# Patient Record
Sex: Female | Born: 1990 | Race: White | Hispanic: No | Marital: Married | Smoking: Never smoker
Health system: Southern US, Community
[De-identification: ages and names within clinical notes are randomized; demographics above are authoritative.]

## PROBLEM LIST (undated history)

## (undated) DIAGNOSIS — Z789 Other specified health status: Secondary | ICD-10-CM

## (undated) HISTORY — PX: NO PAST SURGERIES: SHX2092

## (undated) HISTORY — DX: Other specified health status: Z78.9

---
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
## 2010-05-16 ENCOUNTER — Inpatient Hospital Stay (INDEPENDENT_AMBULATORY_CARE_PROVIDER_SITE_OTHER)
Admission: RE | Admit: 2010-05-16 | Discharge: 2010-05-16 | Disposition: A | Discharge status: Home / Self Care | Payer: Self-pay | Source: Ambulatory Visit | Attending: Emergency Medicine | Admitting: Emergency Medicine

## 2010-05-16 DIAGNOSIS — H6120 Impacted cerumen, unspecified ear: Secondary | ICD-10-CM

## 2015-11-06 LAB — AMB REFERRAL TO OB-GYN: PAP SMEAR: NEGATIVE

## 2016-11-17 DIAGNOSIS — J302 Other seasonal allergic rhinitis: Secondary | ICD-10-CM | POA: Insufficient documentation

## 2017-10-15 LAB — OB RESULTS CONSOLE HGB/HCT, BLOOD
HEMATOCRIT: 39 (ref 29–41)
Hemoglobin: 13

## 2017-10-15 LAB — OB RESULTS CONSOLE RUBELLA ANTIBODY, IGM: Rubella: IMMUNE

## 2017-10-15 LAB — OB RESULTS CONSOLE GC/CHLAMYDIA
Chlamydia: NEGATIVE
Gonorrhea: NEGATIVE

## 2017-10-15 LAB — OB RESULTS CONSOLE HIV ANTIBODY (ROUTINE TESTING): HIV: NONREACTIVE

## 2017-10-15 LAB — OB RESULTS CONSOLE PLATELET COUNT: PLATELETS: 347

## 2017-10-15 LAB — OB RESULTS CONSOLE ABO/RH: RH Type: POSITIVE

## 2017-10-15 LAB — OB RESULTS CONSOLE HEPATITIS B SURFACE ANTIGEN: Hepatitis B Surface Ag: NEGATIVE

## 2017-10-15 LAB — AMB REFERRAL TO OB-GYN: Urine Culture, OB: NEGATIVE

## 2017-12-03 ENCOUNTER — Encounter: Payer: Self-pay | Admitting: *Deleted

## 2017-12-04 ENCOUNTER — Telehealth: Payer: Self-pay

## 2017-12-04 ENCOUNTER — Other Ambulatory Visit: Payer: Self-pay

## 2017-12-04 DIAGNOSIS — Z3480 Encounter for supervision of other normal pregnancy, unspecified trimester: Secondary | ICD-10-CM

## 2017-12-04 NOTE — Telephone Encounter (Signed)
Opened in error

## 2017-12-14 ENCOUNTER — Encounter (HOSPITAL_COMMUNITY): Payer: Self-pay

## 2017-12-17 ENCOUNTER — Ambulatory Visit (INDEPENDENT_AMBULATORY_CARE_PROVIDER_SITE_OTHER): Payer: BLUE CROSS/BLUE SHIELD | Admitting: Internal Medicine

## 2017-12-17 ENCOUNTER — Encounter: Payer: Self-pay | Admitting: Internal Medicine

## 2017-12-17 VITALS — BP 114/73 | HR 113 | Ht 65.0 in | Wt 144.0 lb

## 2017-12-17 DIAGNOSIS — Z3482 Encounter for supervision of other normal pregnancy, second trimester: Secondary | ICD-10-CM

## 2017-12-17 DIAGNOSIS — Z23 Encounter for immunization: Secondary | ICD-10-CM

## 2017-12-17 DIAGNOSIS — Z3480 Encounter for supervision of other normal pregnancy, unspecified trimester: Secondary | ICD-10-CM

## 2017-12-17 NOTE — Progress Notes (Signed)
Subjective:   Amanda Caldwell is a 27 y.o. G1P0 at [redacted]w[redacted]d by LMP, early ultrasound at 8 weeks being seen today for her first obstetrical visit.  Her obstetrical history is significant for none. . Patient does intend to breast feed. Pregnancy history fully reviewed.  Patient reports no complaints.  HISTORY: OB History  Gravida Para Term Preterm AB Living  1 0 0 0 0 0  SAB TAB Ectopic Multiple Live Births  0 0 0 0 0    # Outcome Date GA Lbr Len/2nd Weight Sex Delivery Anes PTL Lv  1 Current            History reviewed. No pertinent past medical history. Reports childhood history of asthma (has not had to use an inhaler since age of 50-79 years old) and childhood history of migraines.  History reviewed. No pertinent surgical history. Elbow surgery for fractured olecranon.  History reviewed. No pertinent family history. Social History   Tobacco Use  . Smoking status: Not on file  Substance Use Topics  . Alcohol use: Not on file  . Drug use: Not on file   Allergies  Allergen Reactions  . Amoxicillin Swelling   Current Outpatient Medications on File Prior to Visit  Medication Sig Dispense Refill  . Prenatal Vit-Fe Fumarate-FA (MULTIVITAMIN-PRENATAL) 27-0.8 MG TABS tablet Take 1 tablet by mouth daily at 12 noon.    . Multiple Vitamin (MULTI-VITAMINS) TABS Take 1 tablet by mouth daily.     No current facility-administered medications on file prior to visit.    Exam   Vitals:   12/17/17 1516 12/17/17 1518  BP: 114/73   Pulse: (!) 113   Weight: 144 lb (65.3 kg)   Height:  5\' 5"  (1.651 m)      Uterus:     Pelvic Exam: Perineum: no hemorrhoids, normal perineum   Vulva: normal external genitalia, no lesions   Vagina:  normal mucosa, normal discharge   Cervix: no lesions and normal, pap smear done.    Adnexa: normal adnexa and no mass, fullness, tenderness   Bony Pelvis: average  System: General: well-developed, well-nourished female in no acute distress   Breast:   normal appearance, no masses or tenderness   Skin: normal coloration and turgor, no rashes   Neurologic: oriented, normal, negative, normal mood   Extremities: normal strength, tone, and muscle mass, ROM of all joints is normal   HEENT PERRLA, extraocular movement intact and sclera clear, anicteric   Mouth/Teeth mucous membranes moist, pharynx normal without lesions and dental hygiene good   Neck supple and no masses   Cardiovascular: regular rate and rhythm   Respiratory:  no respiratory distress, normal breath sounds   Abdomen: soft, non-tender; bowel sounds normal; no masses,  no organomegaly     Assessment:   Pregnancy: G1P0 Patient Active Problem List   Diagnosis Date Noted  . Supervision of other normal pregnancy, antepartum 12/17/2017     Plan:  1. Supervision of other normal pregnancy, antepartum Patient had initial labs, PAP done at [facility]. Transfer for water birth. Water birth practice protocol reviewed and handout given. Patient has already taken waterbirth class. Will try to schedule her with midwife for next prenatal visit.  Initial labs drawn. Early 1 hour GTT not indicated  Flu vax today Continue prenatal vitamins. Ultrasound discussed; fetal anatomic survey: ordered. Problem list reviewed and updated. The nature of [facility] with multiple MDs and other Advanced Practice Providers  was explained to patient; also emphasized that residents, students are part of our team. Routine obstetric precautions reviewed. Return in about 4 weeks (around 01/14/2018) for routine PNC.   De Hollingshead 6:16 PM 12/17/17

## 2017-12-17 NOTE — Patient Instructions (Signed)
Considering Waterbirth? Guide for patients at [facility]  Why consider waterbirth?  . Gentle birth for babies . Less pain medicine used in labor . May allow for passive descent/less pushing . May reduce perineal tears  . More mobility and instinctive maternal position changes . Increased maternal relaxation . Reduced blood pressure in labor  Is waterbirth safe? What are the risks of infection, drowning or other complications?  . Infection: o Very low risk (3.7 % for tub vs 4.8% for bed) o 7 in 8000 waterbirths with documented infection o Poorly cleaned equipment most common cause o Slightly lower group B strep transmission rate  . Drowning o Maternal:  - Very low risk   - Related to seizures or fainting o Newborn:  - Very low risk. No evidence of increased risk of respiratory problems in multiple large studies - Physiological protection from breathing under water - Avoid underwater birth if there are any fetal complications - Once baby's head is out of the water, keep it out.  . Birth complication o Some reports of cord trauma, but risk decreased by bringing baby to surface gradually o No evidence of increased risk of shoulder dystocia. Mothers can usually change positions faster in water than in a bed, possibly aiding the maneuvers to free the shoulder.   You must attend a Waterbirth class at [facility]  3rd Wednesday of every month from 7-9pm  Free  Register by calling [phone] or online at [email or web address]  Bring us the certificate from the class to your prenatal appointment  Meet with a midwife at 36 weeks to see if you can still plan a waterbirth and to sign the consent.   Purchase or rent the following supplies: You are responsible for providing all supplies listed above. **If you do not have all necessary supplies you cannot have a waterbirth.**   Water Birth Pool (Birth Pool in a Box or [employer] for instance)  (Tubs start  ~$125)  Single-use disposable tub liner designed for your brand of tub  Electric drain pump to remove water (We recommend 792 gallon per hour or greater pump.)   New garden hose labeled "lead-free", "suitable for drinking water",  Separate garden hose to remove the dirty water  Fish net  Bathing suit top (optional)  Long-handled mirror (optional)  Places to purchase or rent supplies:   Yourwaterbirth.com for tub purchases and supplies  Waterbirthsolutions.com for tub purchases and supplies  [employer] (www.thelaborladies.com) $275 for tub rental/set-up & take down/kit   [employer] [employer] (http://www.padanc.org/MeetUs.htm) Information regarding doulas (labor support) who provide pool rentals  Things that would prevent you from having a waterbirth:  Premature, <37wks  Previous cesarean birth  Presence of thick meconium-stained fluid  Multiple gestation (Twins, triplets, etc.)  Uncontrolled diabetes or gestational diabetes requiring medication  Hypertension requiring medication or diagnosis of pre-eclampsia  Heavy vaginal bleeding  Non-reassuring fetal heart rate  Active infection (MRSA, etc.). Group B Strep is NOT a contraindication for waterbirth.  If your labor has to be induced and induction method requires continuous monitoring of the baby's heart rate  Other risks/issues identified by your obstetrical provider  Please remember that birth is unpredictable. Under certain unforeseeable circumstances your provider may advise against giving birth in the tub. These decisions will be made on a case-by-case basis and with the safety of you and your baby as our highest priority.   

## 2017-12-21 ENCOUNTER — Ambulatory Visit (HOSPITAL_COMMUNITY)
Admission: RE | Admit: 2017-12-21 | Discharge: 2017-12-21 | Disposition: A | Discharge status: Home / Self Care | Payer: BLUE CROSS/BLUE SHIELD | Source: Ambulatory Visit | Attending: Obstetrics & Gynecology | Admitting: Obstetrics & Gynecology

## 2017-12-21 ENCOUNTER — Encounter: Payer: BLUE CROSS/BLUE SHIELD | Admitting: Obstetrics & Gynecology

## 2017-12-21 DIAGNOSIS — Z363 Encounter for antenatal screening for malformations: Secondary | ICD-10-CM | POA: Diagnosis not present

## 2017-12-21 DIAGNOSIS — Z3482 Encounter for supervision of other normal pregnancy, second trimester: Secondary | ICD-10-CM | POA: Insufficient documentation

## 2017-12-21 DIAGNOSIS — Z3A17 17 weeks gestation of pregnancy: Secondary | ICD-10-CM | POA: Insufficient documentation

## 2017-12-21 DIAGNOSIS — Z3480 Encounter for supervision of other normal pregnancy, unspecified trimester: Secondary | ICD-10-CM | POA: Diagnosis present

## 2018-01-14 ENCOUNTER — Ambulatory Visit (INDEPENDENT_AMBULATORY_CARE_PROVIDER_SITE_OTHER): Payer: BLUE CROSS/BLUE SHIELD | Admitting: Obstetrics and Gynecology

## 2018-01-14 DIAGNOSIS — Z3482 Encounter for supervision of other normal pregnancy, second trimester: Secondary | ICD-10-CM

## 2018-01-14 DIAGNOSIS — Z3480 Encounter for supervision of other normal pregnancy, unspecified trimester: Secondary | ICD-10-CM

## 2018-01-14 DIAGNOSIS — Z3A21 21 weeks gestation of pregnancy: Secondary | ICD-10-CM

## 2018-01-14 NOTE — Progress Notes (Signed)
   PRENATAL VISIT NOTE  Subjective:  Amanda Caldwell is a 27 y.o. G1P0 at 2165w2d being seen today for ongoing prenatal care.  She is currently monitored for the following issues for this low-risk pregnancy and has Supervision of other normal pregnancy, antepartum on their problem list.  Patient reports no complaints.  Contractions: Not present. Vag. Bleeding: None.  Movement: Present. Denies leaking of fluid.   The following portions of the patient's history were reviewed and updated as appropriate: allergies, current medications, past family history, past medical history, past social history, past surgical history and problem list. Problem list updated.  Objective:   Vitals:   01/14/18 1607  BP: 123/78  Pulse: 92  Weight: 150 lb 6.4 oz (68.2 kg)    Fetal Status: Fetal Heart Rate (bpm): 154 Fundal Height: 21 cm Movement: Present     General:  Alert, oriented and cooperative. Patient is in no acute distress.  Skin: Skin is warm and dry. No rash noted.   Cardiovascular: Normal heart rate noted  Respiratory: Normal respiratory effort, no problems with respiration noted  Abdomen: Soft, gravid, appropriate for gestational age.  Pain/Pressure: Absent     Pelvic: Cervical exam deferred        Extremities: Normal range of motion.  Edema: None  Mental Status: Normal mood and affect. Normal behavior. Normal judgment and thought content.   Assessment and Plan:  Pregnancy: G1P0 at 2565w2d  1. Supervision of other normal pregnancy, antepartum  - Doing well. - discussed water birth and birth plan.  - Next visit with CNM   There are no diagnoses linked to this encounter. Preterm labor symptoms and general obstetric precautions including but not limited to vaginal bleeding, contractions, leaking of fluid and fetal movement were reviewed in detail with the patient. Please refer to After Visit Summary for other counseling recommendations.  Return in about 4 weeks (around 02/11/2018).  Future  Appointments  Date Time Provider Department Center  02/15/2018  3:15 PM Armando ReichertHogan, Heather D, CNM [facility]    Venia CarbonJennifer Dennard Vezina, NP

## 2018-02-10 NOTE — L&D Delivery Note (Addendum)
Delivery Note At 12:35 PM a viable and healthy female was delivered via Vaginal, Spontaneous (Presentation: LOA).  APGAR: 8, 9; weight 7 lb 9.5 oz (3445 g).   Placenta status: spontaneous, intact.  Cord:3 vessel  with the following complications: none.   Anesthesia:  None Episiotomy: None Lacerations: 2nd degree;Perineal Suture Repair: 3.0 monocryl Est. Blood Loss (mL): 355  Mom to postpartum.  Baby to Couplet care / Skin to Skin.  Amanda Caldwell is a 28 y.o. female G1P1001 with IUP at [redacted]w[redacted]d admitted for active labor at term.  She labored at home and presented to MAU 10 cm and coping well with contractions.  She was transferred to Labor and Delivery and pushed for a few minutes with limited progress. She then labored down for ~45 minutes to 1 hour, pushed intermittently as desired and moved around in different positions of the pt choice.  She resumed pushing and progressed without any augmentation to complete. Total pushing time less than 1 hour.  Cord clamping delayed by ~3 minutes then clamped by CNM and cut by pt husband. Infant vigorous at delivery. Placenta intact and spontaneous, bleeding minimal.  Second degree laceration of perineum, no extension to posterior vaginal wall, repaired without difficulty.  Mom and baby stable prior to transfer to postpartum. She plans on breastfeeding. She requests natural family planning for birth control.   Amanda Caldwell 05/15/2018, 3:10 PM

## 2018-02-15 ENCOUNTER — Ambulatory Visit (INDEPENDENT_AMBULATORY_CARE_PROVIDER_SITE_OTHER): Payer: Self-pay | Admitting: Advanced Practice Midwife

## 2018-02-15 ENCOUNTER — Encounter: Payer: Self-pay | Admitting: Advanced Practice Midwife

## 2018-02-15 VITALS — BP 111/82 | HR 102 | Wt 156.6 lb

## 2018-02-15 DIAGNOSIS — Z3482 Encounter for supervision of other normal pregnancy, second trimester: Secondary | ICD-10-CM

## 2018-02-15 DIAGNOSIS — Z3480 Encounter for supervision of other normal pregnancy, unspecified trimester: Secondary | ICD-10-CM

## 2018-02-15 NOTE — Progress Notes (Signed)
   PRENATAL VISIT NOTE  Subjective:  Amanda Caldwell is a 28 y.o. G1P0 at [redacted]w[redacted]d being seen today for ongoing prenatal care.  She is currently monitored for the following issues for this low-risk pregnancy and has Supervision of other normal pregnancy, antepartum on their problem list.  Patient reports no complaints.  Contractions: Not present. Vag. Bleeding: None.  Movement: Present. Denies leaking of fluid.   The following portions of the patient's history were reviewed and updated as appropriate: allergies, current medications, past family history, past medical history, past social history, past surgical history and problem list. Problem list updated.  Objective:   Vitals:   02/15/18 1522  BP: 111/82  Pulse: (!) 102  Weight: 156 lb 9.6 oz (71 kg)    Fetal Status: Fetal Heart Rate (bpm): 154 Fundal Height: 25 cm Movement: Present     General:  Alert, oriented and cooperative. Patient is in no acute distress.  Skin: Skin is warm and dry. No rash noted.   Cardiovascular: Normal heart rate noted  Respiratory: Normal respiratory effort, no problems with respiration noted  Abdomen: Soft, gravid, appropriate for gestational age.  Pain/Pressure: Absent     Pelvic: Cervical exam deferred        Extremities: Normal range of motion.  Edema: None  Mental Status: Normal mood and affect. Normal behavior. Normal judgment and thought content.   Assessment and Plan:  Pregnancy: G1P0 at [redacted]w[redacted]d  1. Supervision of other normal pregnancy, antepartum - Routine care - CBC; Future - HIV Antibody (routine testing w rflx); Future - RPR; Future - Glucose Tolerance, 2 Hours w/1 Hour; Future - Waterbirth consent signed today  - Patient with many questions. All questions answered.   Preterm labor symptoms and general obstetric precautions including but not limited to vaginal bleeding, contractions, leaking of fluid and fetal movement were reviewed in detail with the patient. Please refer to After Visit  Summary for other counseling recommendations.  Return in about 4 weeks (around 03/15/2018) for 2 hour GTT and 28 week labs .  No future appointments.  Thressa Sheller DNP, CNM  02/15/18  4:02 PM

## 2018-02-15 NOTE — Patient Instructions (Addendum)
Childbirth Education Options: [facility] Classes:  Childbirth education classes can help you get ready for a positive parenting experience. You can also meet other expectant parents and get free stuff for your baby. Each class runs for five weeks on the same night and costs $45 for the mother-to-be and her support person. Medicaid covers the cost if you are eligible. Call [phone] to register. [facility] Childbirth Education:  [phone] or [phone] or [email or web address]  Baby & Me Class: Discuss newborn & infant parenting and family adjustment issues with other new mothers in a relaxed environment. Each week brings a new speaker or baby-centered activity. We encourage new mothers to join us every Thursday at 11:00am. Babies birth until crawling. No registration or fee. Daddy Boot Camp: This course offers Dads-to-be the tools and knowledge needed to feel confident on their journey to becoming new fathers. Experienced dads, who have been trained as coaches, teach dads-to-be how to hold, comfort, diaper, swaddle and play with their infant while being able to support the new mom as well. A class for men taught by men. $25/dad Big Brother/Big Sister: Let your children share in the joy of a new brother or sister in this special class designed just for them. Class includes discussion about how families care for babies: swaddling, holding, diapering, safety as well as how they can be helpful in their new role. This class is designed for children ages 2 to 6, but any age is welcome. Please register each child individually. $5/child  Mom Talk: This mom-led group offers support and connection to mothers as they journey through the adjustments and struggles of that sometimes overwhelming first year after the birth of a child. Tuesdays at 10:00am and Thursdays at 6:00pm. Babies welcome. No registration or fee. Breastfeeding Support Group: This group is a mother-to-mother  support circle where moms have the opportunity to share their breastfeeding experiences. A Lactation Consultant is present for questions and concerns. Meets each Tuesday at 11:00am. No fee or registration. Breastfeeding Your Baby: Learn what to expect in the first days of breastfeeding your newborn.  This class will help you feel more confident with the skills needed to begin your breastfeeding experience. Many new mothers are concerned about breastfeeding after leaving the hospital. This class will also address the most common fears and challenges about breastfeeding during the first few weeks, months and beyond. (call for fee) Comfort Techniques and Tour: This 2 hour interactive class will provide you the opportunity to learn & practice hands-on techniques that can help relieve some of the discomfort of labor and encourage your baby to rotate toward the best position for birth. You and your partner will be able to try a variety of labor positions with birth balls and rebozos as well as practice breathing, relaxation, and visualization techniques. A tour of the [facility] is included with this class. $20 per registrant and support person Childbirth Class- Weekend Option: This class is a Weekend version of our Birth & Baby series. It is designed for parents who have a difficult time fitting several weeks of classes into their schedule. It covers the care of your newborn and the basics of labor and childbirth. It also includes a Maternity Care Center Tour of [facility] and lunch. The class is held two consecutive days: beginning on Friday evening from 6:30 - 8:30 p.m. and the next day, Saturday from 9 a.m. - 4 p.m. (call for fee) Waterbirth Class: Interested in a waterbirth?  This   informational class will help you discover whether waterbirth is the right fit for you. Education about waterbirth itself, supplies you would need and how to assemble your support team is what you can  expect from this class. Some obstetrical practices require this class in order to pursue a waterbirth. (Not all obstetrical practices offer waterbirth-check with your healthcare provider.) Register only the expectant mom, but you are encouraged to bring your partner to class! Required if planning waterbirth, no fee. Infant/Child CPR: Parents, grandparents, babysitters, and friends learn Cardio-Pulmonary Resuscitation skills for infants and children. You will also learn how to treat both conscious and unconscious choking in infants and children. This Family & Friends program does not offer certification. Register each participant individually to ensure that enough mannequins are available. (Call for fee) Grandparent Love: Expecting a grandbaby? This class is for you! Learn about the latest infant care and safety recommendations and ways to support your own child as he or she transitions into the parenting role. Taught by Registered Nurses who are childbirth instructors, but most importantly...they are grandmothers too! $10/person. Childbirth Class- Natural Childbirth: This series of 5 weekly classes is for expectant parents who want to learn and practice natural methods of coping with the process of labor and childbirth. Relaxation, breathing, massage, visualization, role of the partner, and helpful positioning are highlighted. Participants learn how to be confident in their body's ability to give birth. This class will empower and help parents make informed decisions about their own care. Includes discussion that will help new parents transition into the immediate postpartum period. Maternity Care Center Tour of [facility] is included. We suggest taking this class between 25-32 weeks, but it's only a recommendation. $75 per registrant and one support person or $30 Medicaid. Childbirth Class- 3 week Series: This option of 3 weekly classes helps you and your labor partner prepare for childbirth. Newborn  care, labor & birth, cesarean birth, pain management, and comfort techniques are discussed and a Maternity Care Center Tour of [facility] is included. The class meets at the same time, on the same day of the week for 3 consecutive weeks beginning with the starting date you choose. $60 for registrant and one support person.  Marvelous Multiples: Expecting twins, triplets, or more? This class covers the differences in labor, birth, parenting, and breastfeeding issues that face multiples' parents. NICU tour is included. Led by a Certified Childbirth Educator who is the mother of twins. No fee. Caring for Baby: This class is for expectant and adoptive parents who want to learn and practice the most up-to-date newborn care for their babies. Focus is on birth through the first six weeks of life. Topics include feeding, bathing, diapering, crying, umbilical cord care, circumcision care and safe sleep. Parents learn to recognize symptoms of illness and when to call the pediatrician. Register only the mom-to-be and your partner or support person can plan to come with you! $10 per registrant and support person Childbirth Class- online option: This online class offers you the freedom to complete a Birth and Baby series in the comfort of your own home. The flexibility of this option allows you to review sections at your own pace, at times convenient to you and your support people. It includes additional video information, animations, quizzes, and extended activities. Get organized with helpful eClass tools, checklists, and trackers. Once you register online for the class, you will receive an email within a few days to accept the invitation and begin the class when the time   is right for you. The content will be available to you for 60 days. $60 for 60 days of online access for you and your support people.  Local Doulas: Natural Baby Doulas [email or web address] Tel:  [phone] [email or web address] [email or web address] [phone] [email or web address] The [employer] Ladies  (also do waterbirth tub rental) [phone] [email or web address] [phone] [email or web address] [employer]hyth[employer]  [phone] [email or web address] [place] [employer] (PADA) [email or web address] [place] [facility]  [email or web address] Considering Waterbirth? Guide for patients at [facility]  Why consider waterbirth?  . Gentle birth for babies . Less pain medicine used in labor . May allow for passive descent/less pushing . May reduce perineal tears  . More mobility and instinctive maternal position changes . Increased maternal relaxation . Reduced blood pressure in labor  Is waterbirth safe? What are the risks of infection, drowning or other complications?  . Infection: o Very low risk (3.7 % for tub vs 4.8% for bed) o 7 in 8000 waterbirths with documented infection o Poorly cleaned equipment most common cause o Slightly lower group B strep transmission rate  . Drowning o Maternal:  - Very low risk   - Related to seizures or fainting o Newborn:  - Very low risk. No evidence of increased risk of respiratory problems in multiple large studies - Physiological protection from breathing under water - Avoid underwater birth if there are any fetal complications - Once baby's head is out of the water, keep it out.  . Birth complication o Some reports of cord trauma, but risk decreased by bringing baby to surface gradually o No evidence of increased risk of shoulder dystocia. Mothers can usually change positions faster in water than in a bed, possibly aiding the maneuvers to free the shoulder.   You must attend a Waterbirth class at [facility]  3rd Wednesday of every month from 7-9pm  Free  Register by calling [phone] or online at [email or web address]  Bring us the certificate from the class to your prenatal appointment  Meet with a midwife at 36 weeks to see if you can still plan a waterbirth and to sign the consent.   Purchase or rent the following supplies:   Fish net  Bathing suit top (optional)  Long-handled mirror (optional)  Things that would prevent you from having a waterbirth:  Premature, <37wks  Previous cesarean birth  Presence of thick meconium-stained fluid  Multiple gestation (Twins, triplets, etc.)  Uncontrolled diabetes or gestational diabetes requiring medication  Hypertension requiring medication or diagnosis of pre-eclampsia  Heavy vaginal bleeding  Non-reassuring fetal heart rate  Active infection (MRSA, etc.). Group B Strep is NOT a contraindication for  waterbirth.  If your labor has to be induced and induction method requires continuous  monitoring of the baby's heart rate  Other risks/issues identified by your obstetrical provider  Please remember that birth is unpredictable. Under certain unforeseeable circumstances your provider may advise against giving birth in the tub. These decisions will be made on a case-by-case basis and with the safety of you and your baby as our highest priority.      

## 2018-02-16 ENCOUNTER — Encounter: Payer: Self-pay | Admitting: *Deleted

## 2018-03-15 ENCOUNTER — Ambulatory Visit (INDEPENDENT_AMBULATORY_CARE_PROVIDER_SITE_OTHER): Payer: Self-pay | Admitting: Advanced Practice Midwife

## 2018-03-15 ENCOUNTER — Encounter: Payer: Self-pay | Admitting: Advanced Practice Midwife

## 2018-03-15 ENCOUNTER — Other Ambulatory Visit: Payer: Self-pay

## 2018-03-15 VITALS — BP 104/74 | HR 97 | Wt 160.8 lb

## 2018-03-15 DIAGNOSIS — Z23 Encounter for immunization: Secondary | ICD-10-CM

## 2018-03-15 DIAGNOSIS — Z3483 Encounter for supervision of other normal pregnancy, third trimester: Secondary | ICD-10-CM

## 2018-03-15 DIAGNOSIS — Z3480 Encounter for supervision of other normal pregnancy, unspecified trimester: Secondary | ICD-10-CM

## 2018-03-15 NOTE — Progress Notes (Signed)
Patient ID: Amanda Caldwell, female   DOB: 1990/06/01, 28 y.o.   MRN: [id number]   PRENATAL VISIT NOTE  Subjective:  Amanda Caldwell is a 28 y.o. G1P0 at [redacted]w[redacted]d being seen today for ongoing prenatal care.  She is currently monitored for the following issues for this low-risk pregnancy and has Supervision of other normal pregnancy, antepartum on their problem list.  Patient reports no complaints.  Contractions: Not present. Vag. Bleeding: None.  Movement: Present. Denies leaking of fluid.   The following portions of the patient's history were reviewed and updated as appropriate: allergies, current medications, past family history, past medical history, past social history, past surgical history and problem list. Problem list updated.  Objective:   Vitals:   03/15/18 0851  BP: 104/74  Pulse: 97  Weight: 160 lb 12.8 oz (72.9 kg)    Fetal Status: Fetal Heart Rate (bpm): 147 Fundal Height: 29 cm Movement: Present     General:  Alert, oriented and cooperative. Patient is in no acute distress.  Skin: Skin is warm and dry. No rash noted.   Cardiovascular: Normal heart rate noted  Respiratory: Normal respiratory effort, no problems with respiration noted  Abdomen: Soft, gravid, appropriate for gestational age.  Pain/Pressure: Absent     Pelvic: Cervical exam deferred        Extremities: Normal range of motion.  Edema: None  Mental Status: Normal mood and affect. Normal behavior. Normal judgment and thought content.   Assessment and Plan:  Pregnancy: G1P0 at [redacted]w[redacted]d  1. Supervision of other normal pregnancy, antepartum - Routine care - 28 week labs and 2 hour GTT today  - Tdap vaccine greater than or equal to 7yo IM  Preterm labor symptoms and general obstetric precautions including but not limited to vaginal bleeding, contractions, leaking of fluid and fetal movement were reviewed in detail with the patient. Please refer to After Visit Summary for other counseling recommendations.  Return  in about 2 weeks (around 03/29/2018).  No future appointments.  Thressa Sheller DNP, CNM  03/15/18  10:28 AM

## 2018-03-15 NOTE — Patient Instructions (Signed)
Considering Waterbirth? Guide for patients at [facility]  Why consider waterbirth?  . Gentle birth for babies . Less pain medicine used in labor . May allow for passive descent/less pushing . May reduce perineal tears  . More mobility and instinctive maternal position changes . Increased maternal relaxation . Reduced blood pressure in labor  Is waterbirth safe? What are the risks of infection, drowning or other complications?  . Infection: o Very low risk (3.7 % for tub vs 4.8% for bed) o 7 in 8000 waterbirths with documented infection o Poorly cleaned equipment most common cause o Slightly lower group B strep transmission rate  . Drowning o Maternal:  - Very low risk   - Related to seizures or fainting o Newborn:  - Very low risk. No evidence of increased risk of respiratory problems in multiple large studies - Physiological protection from breathing under water - Avoid underwater birth if there are any fetal complications - Once baby's head is out of the water, keep it out.  . Birth complication o Some reports of cord trauma, but risk decreased by bringing baby to surface gradually o No evidence of increased risk of shoulder dystocia. Mothers can usually change positions faster in water than in a bed, possibly aiding the maneuvers to free the shoulder.   You must attend a Linden Dolin class at [facility]  3rd Wednesday of every month from 7-9pm  [employer] by calling [phone] or online at [email or web address]  Bring [place] the certificate from the class to your prenatal appointment  Meet with a midwife at 36 weeks to see if you can still plan a waterbirth and to sign the consent.   If you plan a waterbirth after April 04, 2018, at [facility] and [facility], you will need to purchase the following:  Fish net  Bathing suit top (optional)  Long-handled mirror (optional)  Things that would prevent you from  having a waterbirth:  Premature, <37wks  Previous cesarean birth  Presence of thick meconium-stained fluid  Multiple gestation (Twins, triplets, etc.)  Uncontrolled diabetes or gestational diabetes requiring medication  Hypertension requiring medication or diagnosis of pre-eclampsia  Heavy vaginal bleeding  Non-reassuring fetal heart rate  Active infection (MRSA, etc.). Group B Strep is NOT a contraindication for waterbirth.  If your labor has to be induced and induction method requires continuous monitoring of the baby's heart rate  Other risks/issues identified by your obstetrical provider  Please remember that birth is unpredictable. Under certain unforeseeable circumstances your provider may advise against giving birth in the tub. These decisions will be made on a case-by-case basis and with the safety of you and your baby as our highest priority.

## 2018-03-15 NOTE — Progress Notes (Signed)
Pt feels that she has gained a lot of weight since last visit.

## 2018-03-15 NOTE — Progress Notes (Unsigned)
   PRENATAL VISIT NOTE  Subjective:  Amanda Caldwell is a 28 y.o. G1P0 at [redacted]w[redacted]d being seen today for ongoing prenatal care.  She is currently monitored for the following issues for this low-risk pregnancy and has Supervision of other normal pregnancy, antepartum on their problem list.  Patient reports {sx:14538}.   .  .   . Denies leaking of fluid.   The following portions of the patient's history were reviewed and updated as appropriate: allergies, current medications, past family history, past medical history, past social history, past surgical history and problem list. Problem list updated.  Objective:  There were no vitals filed for this visit.  Fetal Status:           General:  Alert, oriented and cooperative. Patient is in no acute distress.  Skin: Skin is warm and dry. No rash noted.   Cardiovascular: Normal heart rate noted  Respiratory: Normal respiratory effort, no problems with respiration noted  Abdomen: Soft, gravid, appropriate for gestational age.        Pelvic: {Blank single:19197::"Cervical exam performed","Cervical exam deferred"}        Extremities: Normal range of motion.     Mental Status: Normal mood and affect. Normal behavior. Normal judgment and thought content.   Assessment and Plan:  Pregnancy: G1P0 at [redacted]w[redacted]d  1. Supervision of other normal pregnancy, antepartum *** - Glucose Tolerance, 2 Hours w/1 Hour - RPR - HIV Antibody (routine testing w rflx) - CBC  {Blank single:19197::"Term","Preterm"} labor symptoms and general obstetric precautions including but not limited to vaginal bleeding, contractions, leaking of fluid and fetal movement were reviewed in detail with the patient. Please refer to After Visit Summary for other counseling recommendations.  No follow-ups on file.  Future Appointments  Date Time Provider Department Center  03/15/2018  9:35 AM Armando Reichert, CNM [facility]    Bernerd Limbo, Student-MidWife

## 2018-03-16 LAB — CBC
Hematocrit: 34.3 % (ref 34.0–46.6)
Hemoglobin: 11.3 g/dL (ref 11.1–15.9)
MCH: 28.7 pg (ref 26.6–33.0)
MCHC: 32.9 g/dL (ref 31.5–35.7)
MCV: 87 fL (ref 79–97)
PLATELETS: 279 10*3/uL (ref 150–450)
RBC: 3.94 x10E6/uL (ref 3.77–5.28)
RDW: 12.1 % (ref 11.7–15.4)
WBC: 11.8 10*3/uL — ABNORMAL HIGH (ref 3.4–10.8)

## 2018-03-16 LAB — GLUCOSE TOLERANCE, 2 HOURS W/ 1HR
GLUCOSE, 2 HOUR: 114 mg/dL (ref 65–152)
Glucose, 1 hour: 163 mg/dL (ref 65–179)
Glucose, Fasting: 70 mg/dL (ref 65–91)

## 2018-03-16 LAB — HIV ANTIBODY (ROUTINE TESTING W REFLEX): HIV Screen 4th Generation wRfx: NONREACTIVE

## 2018-03-16 LAB — RPR: RPR: NONREACTIVE

## 2018-03-29 ENCOUNTER — Other Ambulatory Visit: Payer: Self-pay

## 2018-03-29 ENCOUNTER — Ambulatory Visit (INDEPENDENT_AMBULATORY_CARE_PROVIDER_SITE_OTHER): Payer: Self-pay | Admitting: Internal Medicine

## 2018-03-29 DIAGNOSIS — Z3483 Encounter for supervision of other normal pregnancy, third trimester: Secondary | ICD-10-CM

## 2018-03-29 DIAGNOSIS — Z3A31 31 weeks gestation of pregnancy: Secondary | ICD-10-CM

## 2018-03-29 DIAGNOSIS — Z3480 Encounter for supervision of other normal pregnancy, unspecified trimester: Secondary | ICD-10-CM

## 2018-03-29 NOTE — Progress Notes (Signed)
   PRENATAL VISIT NOTE  Subjective:  Amanda Caldwell is a 28 y.o. G1P0 at [redacted]w[redacted]d being seen today for ongoing prenatal care.  She is currently monitored for the following issues for this low-risk pregnancy and has Supervision of other normal pregnancy, antepartum on their problem list.  Patient reports no complaints.  Contractions: Irritability. Vag. Bleeding: None.  Movement: Present. Denies leaking of fluid.   The following portions of the patient's history were reviewed and updated as appropriate: allergies, current medications, past family history, past medical history, past social history, past surgical history and problem list. Problem list updated.  Objective:   Vitals:   03/29/18 1435  BP: 107/72  Pulse: 86  Weight: 163 lb (73.9 kg)    Fetal Status: Fetal Heart Rate (bpm): 141 Fundal Height: 31 cm Movement: Present     General:  Alert, oriented and cooperative. Patient is in no acute distress.  Skin: Skin is warm and dry. No rash noted.   Cardiovascular: Normal heart rate noted  Respiratory: Normal respiratory effort, no problems with respiration noted  Abdomen: Soft, gravid, appropriate for gestational age.  Pain/Pressure: Present     Pelvic: Cervical exam deferred        Extremities: Normal range of motion.  Edema: None  Mental Status: Normal mood and affect. Normal behavior. Normal judgment and thought content.   Assessment and Plan:  Pregnancy: G1P0 at [redacted]w[redacted]d  1. Supervision of other normal pregnancy, antepartum Continue routine PNC.  Reviewed 28 week lab results with patient.  Reviewed braxton hicks contractions.    There are no diagnoses linked to this encounter. Preterm labor symptoms and general obstetric precautions including but not limited to vaginal bleeding, contractions, leaking of fluid and fetal movement were reviewed in detail with the patient. Please refer to After Visit Summary for other counseling recommendations.  Return in about 2 weeks (around  04/12/2018) for routine PNC.  No future appointments.  De Hollingshead, DO

## 2018-03-29 NOTE — Patient Instructions (Addendum)
Third Trimester of Pregnancy  The third trimester is from week 28 through week 40 (months 7 through 9). This trimester is when your unborn baby (fetus) is growing very fast. At the end of the ninth month, the unborn baby is about 20 inches in length. It weighs about 6-10 pounds. Follow these instructions at home: Medicines  Take over-the-counter and prescription medicines only as told by your doctor. Some medicines are safe and some medicines are not safe during pregnancy.  Take a prenatal vitamin that contains at least 600 micrograms (mcg) of folic acid.  If you have trouble pooping (constipation), take medicine that will make your stool soft (stool softener) if your doctor approves. Eating and drinking   Eat regular, healthy meals.  Avoid raw meat and uncooked cheese.  If you get low calcium from the food you eat, talk to your doctor about taking a daily calcium supplement.  Eat four or five small meals rather than three large meals a day.  Avoid foods that are high in fat and sugars, such as fried and sweet foods.  To prevent constipation: ? Eat foods that are high in fiber, like fresh fruits and vegetables, whole grains, and beans. ? Drink enough fluids to keep your pee (urine) clear or pale yellow. Activity  Exercise only as told by your doctor. Stop exercising if you start to have cramps.  Avoid heavy lifting, wear low heels, and sit up straight.  Do not exercise if it is too hot, too humid, or if you are in a place of great height (high altitude).  You may continue to have sex unless your doctor tells you not to. Relieving pain and discomfort  Wear a good support bra if your breasts are tender.  Take frequent breaks and rest with your legs raised if you have leg cramps or low back pain.  Take warm water baths (sitz baths) to soothe pain or discomfort caused by hemorrhoids. Use hemorrhoid cream if your doctor approves.  If you develop puffy, bulging veins (varicose  veins) in your legs: ? Wear support hose or compression stockings as told by your doctor. ? Raise (elevate) your feet for 15 minutes, 3-4 times a day. ? Limit salt in your food. Safety  Wear your seat belt when driving.  Make a list of emergency phone numbers, including numbers for family, friends, the hospital, and police and fire departments. Preparing for your baby's arrival To prepare for the arrival of your baby:  Take prenatal classes.  Practice driving to the hospital.  Visit the hospital and tour the maternity area.  Talk to your work about taking leave once the baby comes.  Pack your hospital bag.  Prepare the baby's room.  Go to your doctor visits.  Buy a rear-facing car seat. Learn how to install it in your car. General instructions  Do not use hot tubs, steam rooms, or saunas.  Do not use any products that contain nicotine or tobacco, such as cigarettes and e-cigarettes. If you need help quitting, ask your doctor.  Do not drink alcohol.  Do not douche or use tampons or scented sanitary pads.  Do not cross your legs for long periods of time.  Do not travel for long distances unless you must. Only do so if your doctor says it is okay.  Visit your dentist if you have not gone during your pregnancy. Use a soft toothbrush to brush your teeth. Be gentle when you floss.  Avoid cat litter boxes and soil   used by cats. These carry germs that can cause birth defects in the baby and can cause a loss of your baby (miscarriage) or stillbirth.  Keep all your prenatal visits as told by your doctor. This is important. Contact a doctor if:  You are not sure if you are in labor or if your water has broken.  You are dizzy.  You have mild cramps or pressure in your lower belly.  You have a nagging pain in your belly area.  You continue to feel sick to your stomach, you throw up, or you have watery poop.  You have bad smelling fluid coming from your vagina.  You have  pain when you pee. Get help right away if:  You have a fever.  You are leaking fluid from your vagina.  You are spotting or bleeding from your vagina.  You have severe belly cramps or pain.  You lose or gain weight quickly.  You have trouble catching your breath and have chest pain.  You notice sudden or extreme puffiness (swelling) of your face, hands, ankles, feet, or legs.  You have not felt the baby move in over an hour.  You have severe headaches that do not go away with medicine.  You have trouble seeing.  You are leaking, or you are having a gush of fluid, from your vagina before you are 37 weeks.  You have regular belly spasms (contractions) before you are 37 weeks. Summary  The third trimester is from week 28 through week 40 (months 7 through 9). This time is when your unborn baby is growing very fast.  Follow your doctor's advice about medicine, food, and activity.  Get ready for the arrival of your baby by taking prenatal classes, getting all the baby items ready, preparing the baby's room, and visiting your doctor to be checked.  Get help right away if you are bleeding from your vagina, or you have chest pain and trouble catching your breath, or if you have not felt your baby move in over an hour. This information is not intended to replace advice given to you by your health care provider. Make sure you discuss any questions you have with your health care provider. Document Released: 04/23/2009 Document Revised: 03/04/2016 Document Reviewed: 03/04/2016 Elsevier Interactive Patient Education  2019 Elsevier Inc.   [employer] of the uterus can occur throughout pregnancy, but they are not always a sign that you are in labor. You may have practice contractions called Braxton Hicks contractions. These false labor contractions are sometimes confused with true labor. What are Deberah Pelton contractions? Braxton Hicks contractions are  tightening movements that occur in the muscles of the uterus before labor. Unlike true labor contractions, these contractions do not result in opening (dilation) and thinning of the cervix. Toward the end of pregnancy (32-34 weeks), Braxton Hicks contractions can happen more often and may become stronger. These contractions are sometimes difficult to tell apart from true labor because they can be very uncomfortable. You should not feel embarrassed if you go to the hospital with false labor. Sometimes, the only way to tell if you are in true labor is for your health care provider to look for changes in the cervix. The health care provider will do a physical exam and may monitor your contractions. If you are not in true labor, the exam should show that your cervix is not dilating and your water has not broken. If there are no other health problems associated with  your pregnancy, it is completely safe for you to be sent home with false labor. You may continue to have Braxton Hicks contractions until you go into true labor. How to tell the difference between true labor and false labor True labor  Contractions last 30-70 seconds.  Contractions become very regular.  Discomfort is usually felt in the top of the uterus, and it spreads to the lower abdomen and low back.  Contractions do not go away with walking.  Contractions usually become more intense and increase in frequency.  The cervix dilates and gets thinner. False labor  Contractions are usually shorter and not as strong as true labor contractions.  Contractions are usually irregular.  Contractions are often felt in the front of the lower abdomen and in the groin.  Contractions may go away when you walk around or change positions while lying down.  Contractions get weaker and are shorter-lasting as time goes on.  The cervix usually does not dilate or become thin. Follow these instructions at home:   Take over-the-counter and  prescription medicines only as told by your health care provider.  Keep up with your usual exercises and follow other instructions from your health care provider.  Eat and drink lightly if you think you are going into labor.  If Braxton Hicks contractions are making you uncomfortable: ? Change your position from lying down or resting to walking, or change from walking to resting. ? Sit and rest in a tub of warm water. ? Drink enough fluid to keep your urine pale yellow. Dehydration may cause these contractions. ? Do slow and deep breathing several times an hour.  Keep all follow-up prenatal visits as told by your health care provider. This is important. Contact a health care provider if:  You have a fever.  You have continuous pain in your abdomen. Get help right away if:  Your contractions become stronger, more regular, and closer together.  You have fluid leaking or gushing from your vagina.  You pass blood-tinged mucus (bloody show).  You have bleeding from your vagina.  You have low back pain that you never had before.  You feel your baby's head pushing down and causing pelvic pressure.  Your baby is not moving inside you as much as it used to. Summary  Contractions that occur before labor are called Braxton Hicks contractions, false labor, or practice contractions.  Braxton Hicks contractions are usually shorter, weaker, farther apart, and less regular than true labor contractions. True labor contractions usually become progressively stronger and regular, and they become more frequent.  Manage discomfort from [facility] contractions by changing position, resting in a warm bath, drinking plenty of water, or practicing deep breathing. This information is not intended to replace advice given to you by your health care provider. Make sure you discuss any questions you have with your health care provider. Document Released: 06/12/2016 Document Revised: 11/11/2016 Document  Reviewed: 06/12/2016 Elsevier Interactive Patient Education  2019 [employer].

## 2018-03-30 ENCOUNTER — Encounter: Payer: Self-pay | Admitting: Obstetrics & Gynecology

## 2018-04-13 ENCOUNTER — Ambulatory Visit (INDEPENDENT_AMBULATORY_CARE_PROVIDER_SITE_OTHER): Payer: Self-pay | Admitting: Student

## 2018-04-13 VITALS — BP 117/83 | HR 93 | Wt 170.4 lb

## 2018-04-13 DIAGNOSIS — Z3480 Encounter for supervision of other normal pregnancy, unspecified trimester: Secondary | ICD-10-CM

## 2018-04-13 DIAGNOSIS — Z3483 Encounter for supervision of other normal pregnancy, third trimester: Secondary | ICD-10-CM

## 2018-04-14 NOTE — Progress Notes (Signed)
   PRENATAL VISIT NOTE  Subjective:  Amanda Caldwell is a 28 y.o. G1P0 at [redacted]w[redacted]d being seen today for ongoing prenatal care.  She is currently monitored for the following issues for this low-risk pregnancy and has Supervision of other normal pregnancy, antepartum on their problem list.  Patient reports no complaints other than some pelvic pain related to positions. Contractions: Irregular. Vag. Bleeding: None.  Movement: Present. Denies leaking of fluid.   The following portions of the patient's history were reviewed and updated as appropriate: allergies, current medications, past family history, past medical history, past social history, past surgical history and problem list. Problem list updated.  Objective:   Vitals:   04/13/18 1637  BP: 117/83  Pulse: 93  Weight: 170 lb 6.4 oz (77.3 kg)    Fetal Status: Fetal Heart Rate (bpm): 139 Fundal Height: 34 cm Movement: Present     General:  Alert, oriented and cooperative. Patient is in no acute distress.  Skin: Skin is warm and dry. No rash noted.   Cardiovascular: Normal heart rate noted  Respiratory: Normal respiratory effort, no problems with respiration noted  Abdomen: Soft, gravid, appropriate for gestational age.  Pain/Pressure: Present     Pelvic: Cervical exam deferred        Extremities: Normal range of motion.  Edema: Trace  Mental Status: Normal mood and affect. Normal behavior. Normal judgment and thought content.   Assessment and Plan:  Pregnancy: G1P0 at [redacted]w[redacted]d   1. Reviewed birth plan with patient; patient will modify and bring back in two weeks.  2. Reviewed WB and risk factors, patient understands that WB is only for low risk patients and that should her LR status change, she would not be able to do a waterbirth.  3. Reviewed signs of labor and when to come to [facility]U.   There are no diagnoses linked to this encounter. Preterm labor symptoms and general obstetric precautions including but not limited to vaginal  bleeding, contractions, leaking of fluid and fetal movement were reviewed in detail with the patient. Please refer to After Visit Summary for other counseling recommendations.  Return in about 2 weeks (around 04/27/2018), or LROB.  Future Appointments  Date Time Provider Department Center  04/27/2018  2:35 PM Marylene Land, CNM [facility]    Charlesetta Garibaldi [place], [place]

## 2018-04-26 ENCOUNTER — Telehealth: Payer: Self-pay | Admitting: Family Medicine

## 2018-04-26 NOTE — Telephone Encounter (Signed)
Attempted to call patient to inform her of the office restrictions. No answer, left detailed message with the restrictions.

## 2018-04-27 ENCOUNTER — Telehealth: Payer: Self-pay | Admitting: Family Medicine

## 2018-04-27 ENCOUNTER — Ambulatory Visit: Payer: BLUE CROSS/BLUE SHIELD | Admitting: Student

## 2018-04-27 ENCOUNTER — Other Ambulatory Visit: Payer: Self-pay

## 2018-04-27 VITALS — BP 123/86 | HR 102 | Wt 172.6 lb

## 2018-04-27 DIAGNOSIS — Z113 Encounter for screening for infections with a predominantly sexual mode of transmission: Secondary | ICD-10-CM | POA: Diagnosis not present

## 2018-04-27 DIAGNOSIS — Z3483 Encounter for supervision of other normal pregnancy, third trimester: Secondary | ICD-10-CM

## 2018-04-27 DIAGNOSIS — N898 Other specified noninflammatory disorders of vagina: Secondary | ICD-10-CM

## 2018-04-27 DIAGNOSIS — Z3480 Encounter for supervision of other normal pregnancy, unspecified trimester: Secondary | ICD-10-CM

## 2018-04-27 DIAGNOSIS — Z3A36 36 weeks gestation of pregnancy: Secondary | ICD-10-CM

## 2018-04-27 DIAGNOSIS — B373 Candidiasis of vulva and vagina: Secondary | ICD-10-CM | POA: Diagnosis not present

## 2018-04-27 NOTE — Telephone Encounter (Signed)
Called patient to let her know that her FMLA paperwork has been completed and ready for pick-up along with the $15 payment. Patient verbalized understanding.

## 2018-04-27 NOTE — Progress Notes (Signed)
   PRENATAL VISIT NOTE  Subjective:  Amanda Caldwell is a 28 y.o. G1P0 at [redacted]w[redacted]d being seen today for ongoing prenatal care.  She is currently monitored for the following issues for this low-risk pregnancy and has Supervision of other normal pregnancy, antepartum on their problem list.  Patient reports occasional vaginal itching. She has many questions about mucous plug, signs of labor. .  Contractions: Irritability. Vag. Bleeding: None.  Movement: Present. Denies leaking of fluid.   The following portions of the patient's history were reviewed and updated as appropriate: allergies, current medications, past family history, past medical history, past social history, past surgical history and problem list.   Objective:   Vitals:   04/27/18 1445  BP: 123/86  Pulse: (!) 102  Weight: 172 lb 9.6 oz (78.3 kg)    Fetal Status: Fetal Heart Rate (bpm): 152 Fundal Height: 36 cm Movement: Present  Presentation: Vertex  General:  Alert, oriented and cooperative. Patient is in no acute distress.  Skin: Skin is warm and dry. No rash noted.   Cardiovascular: Normal heart rate noted  Respiratory: Normal respiratory effort, no problems with respiration noted  Abdomen: Soft, gravid, appropriate for gestational age.  Pain/Pressure: Present     Pelvic: Cervical exam deferred Dilation: 1.5 Effacement (%): 80    Extremities: Normal range of motion.  Edema: Trace  Mental Status: Normal mood and affect. Normal behavior. Normal judgment and thought content.   Assessment and Plan:  Pregnancy: G1P0 at [redacted]w[redacted]d 1. Supervision of other normal pregnancy, antepartum -Doing well, no complaints. Discussed signs of labor; discussed Baby[employer] optimization given that patient is low risk and may be able to have fewer visits.  - Cervicovaginal ancillary only( [place]) - Enroll Patient in [employer] - Strep Gp B Culture+Rflx  Preterm labor symptoms and general obstetric precautions including but not limited to vaginal  bleeding, contractions, leaking of fluid and fetal movement were reviewed in detail with the patient. Please refer to After Visit Summary for other counseling recommendations.   No follow-ups on file.  Future Appointments  Date Time Provider Department Center  05/11/2018  3:55 PM Marylene Land, CNM [facility]    Charlesetta Garibaldi [place], [place]

## 2018-04-28 DIAGNOSIS — Z029 Encounter for administrative examinations, unspecified: Secondary | ICD-10-CM

## 2018-04-28 LAB — CERVICOVAGINAL ANCILLARY ONLY
BACTERIAL VAGINITIS: NEGATIVE
CHLAMYDIA, DNA PROBE: NEGATIVE
Candida vaginitis: POSITIVE — AB
Neisseria Gonorrhea: NEGATIVE

## 2018-04-30 ENCOUNTER — Other Ambulatory Visit: Payer: Self-pay | Admitting: Student

## 2018-04-30 LAB — STREP GP B CULTURE+RFLX: STREP GP B CULTURE+RFLX: NEGATIVE

## 2018-04-30 MED ORDER — TERCONAZOLE 0.4 % VA CREA: 1.0000 | TOPICAL_CREAM | Freq: Every day | VAGINAL | 0 refills | Status: DC

## 2018-04-30 NOTE — Progress Notes (Unsigned)
tera 

## 2018-05-04 ENCOUNTER — Telehealth: Payer: Self-pay | Admitting: Student

## 2018-05-04 ENCOUNTER — Encounter: Payer: Self-pay | Admitting: Student

## 2018-05-04 NOTE — Telephone Encounter (Signed)
Called patient to see if she was enrolled in [employer]; she said that she has not gotten it. Will double check and make sure she gets enrolled.  Luna Kitchens

## 2018-05-04 NOTE — Addendum Note (Signed)
Addended by: Chrystie Nose on: 05/04/2018 11:56 AM   Modules accepted: Orders

## 2018-05-11 ENCOUNTER — Ambulatory Visit (INDEPENDENT_AMBULATORY_CARE_PROVIDER_SITE_OTHER): Payer: BLUE CROSS/BLUE SHIELD | Admitting: Student

## 2018-05-11 ENCOUNTER — Other Ambulatory Visit: Payer: Self-pay

## 2018-05-11 ENCOUNTER — Telehealth: Payer: Self-pay | Admitting: Student

## 2018-05-11 VITALS — BP 116/85 | HR 88 | Temp 97.9°F | Wt 179.3 lb

## 2018-05-11 DIAGNOSIS — Z3483 Encounter for supervision of other normal pregnancy, third trimester: Secondary | ICD-10-CM

## 2018-05-11 DIAGNOSIS — Z3480 Encounter for supervision of other normal pregnancy, unspecified trimester: Secondary | ICD-10-CM

## 2018-05-11 DIAGNOSIS — Z3A38 38 weeks gestation of pregnancy: Secondary | ICD-10-CM

## 2018-05-11 NOTE — Telephone Encounter (Signed)
The patient called in to confirm her appointment. Stated that she does not have access to cuffs.

## 2018-05-11 NOTE — Patient Instructions (Signed)

## 2018-05-12 NOTE — Progress Notes (Signed)
   PRENATAL VISIT NOTE  Subjective:  Amanda Caldwell is a 28 y.o. G1P0 at [redacted]w[redacted]d being seen today for ongoing prenatal care.  She is currently monitored for the following issues for this low-risk pregnancy and has Supervision of other normal pregnancy, antepartum on their problem list.  Patient reports no complaints.  Contractions: Irritability. Vag. Bleeding: None.  Movement: Present. Denies leaking of fluid.   The following portions of the patient's history were reviewed and updated as appropriate: allergies, current medications, past family history, past medical history, past social history, past surgical history and problem list.   Objective:   Vitals:   05/11/18 1625  BP: 116/85  Pulse: 88  Temp: 97.9 F (36.6 C)  Weight: 179 lb 4.8 oz (81.3 kg)    Fetal Status: Fetal Heart Rate (bpm): 146 Fundal Height: 38 cm Movement: Present     General:  Alert, oriented and cooperative. Patient is in no acute distress.  Skin: Skin is warm and dry. No rash noted.   Cardiovascular: Normal heart rate noted  Respiratory: Normal respiratory effort, no problems with respiration noted  Abdomen: Soft, gravid, appropriate for gestational age.  Pain/Pressure: Present     Pelvic: Cervical exam deferred        Extremities: Normal range of motion.  Edema: Trace  Mental Status: Normal mood and affect. Normal behavior. Normal judgment and thought content.   Assessment and Plan:  Pregnancy: G1P0 at [redacted]w[redacted]d 1. Supervision of other normal pregnancy, antepartum    Patient very upset to learn that WB is not permitted due to Covid-19 restrictions. Reassured patient that we will do everything we can to help her through her labor.   Term labor symptoms and general obstetric precautions including but not limited to vaginal bleeding, contractions, leaking of fluid and fetal movement were reviewed in detail with the patient. Please refer to After Visit Summary for other counseling recommendations.  -Patient  enrolled in [name] with RN; will check her BP next week.  -Patient to skip appt next week (per MD); will have appt with provider and also have NST/BPP between 40 and 41 weeks.  Return in about 2 weeks (around 05/25/2018), or 2 weeks for LROB.  Future Appointments  Date Time Provider Department Center  05/25/2018  3:35 PM Marylene Land, CNM [facility]    Charlesetta Garibaldi [place], [place]

## 2018-05-15 ENCOUNTER — Inpatient Hospital Stay (HOSPITAL_COMMUNITY)
Admission: AD | Admit: 2018-05-15 | Discharge: 2018-05-17 | DRG: 807 | Disposition: A | Discharge status: Home / Self Care | Payer: BLUE CROSS/BLUE SHIELD | Attending: Obstetrics and Gynecology | Admitting: Obstetrics and Gynecology

## 2018-05-15 ENCOUNTER — Encounter (HOSPITAL_COMMUNITY): Payer: Self-pay | Admitting: *Deleted

## 2018-05-15 DIAGNOSIS — Z3A38 38 weeks gestation of pregnancy: Secondary | ICD-10-CM | POA: Diagnosis not present

## 2018-05-15 DIAGNOSIS — O26893 Other specified pregnancy related conditions, third trimester: Secondary | ICD-10-CM | POA: Diagnosis present

## 2018-05-15 MED ORDER — BENZOCAINE-MENTHOL 20-0.5 % EX AERO
1.0000 "application " | INHALATION_SPRAY | CUTANEOUS | Status: DC | PRN
  Administered 2018-05-15: 1 via TOPICAL
  Filled 2018-05-15: qty 56

## 2018-05-15 MED ORDER — SOD CITRATE-CITRIC ACID 500-334 MG/5ML PO SOLN: 30.0000 mL | ORAL | Status: DC | PRN

## 2018-05-15 MED ORDER — LACTATED RINGERS IV SOLN: INTRAVENOUS | Status: DC

## 2018-05-15 MED ORDER — DIBUCAINE 1 % RE OINT: 1.0000 "application " | TOPICAL_OINTMENT | RECTAL | Status: DC | PRN

## 2018-05-15 MED ORDER — ONDANSETRON HCL 4 MG/2ML IJ SOLN: 4.0000 mg | INTRAMUSCULAR | Status: DC | PRN

## 2018-05-15 MED ORDER — OXYCODONE-ACETAMINOPHEN 5-325 MG PO TABS: 1.0000 | ORAL_TABLET | ORAL | Status: DC | PRN

## 2018-05-15 MED ORDER — WITCH HAZEL-GLYCERIN EX PADS: 1.0000 "application " | MEDICATED_PAD | CUTANEOUS | Status: DC | PRN

## 2018-05-15 MED ORDER — OXYTOCIN 40 UNITS IN NORMAL SALINE INFUSION - SIMPLE MED: 2.5000 [IU]/h | INTRAVENOUS | Status: DC

## 2018-05-15 MED ORDER — OXYCODONE-ACETAMINOPHEN 5-325 MG PO TABS: 2.0000 | ORAL_TABLET | ORAL | Status: DC | PRN

## 2018-05-15 MED ORDER — COCONUT OIL OIL: 1.0000 "application " | TOPICAL_OIL | Status: DC | PRN

## 2018-05-15 MED ORDER — SIMETHICONE 80 MG PO CHEW: 80.0000 mg | CHEWABLE_TABLET | ORAL | Status: DC | PRN

## 2018-05-15 MED ORDER — OXYTOCIN 10 UNIT/ML IJ SOLN
INTRAMUSCULAR | Status: AC
  Administered 2018-05-15: 13:00:00 10 [IU]
  Filled 2018-05-15: qty 1

## 2018-05-15 MED ORDER — OXYTOCIN BOLUS FROM INFUSION: 500.0000 mL | Freq: Once | INTRAVENOUS | Status: DC

## 2018-05-15 MED ORDER — TETANUS-DIPHTH-ACELL PERTUSSIS 5-2.5-18.5 LF-MCG/0.5 IM SUSP: 0.5000 mL | Freq: Once | INTRAMUSCULAR | Status: DC

## 2018-05-15 MED ORDER — LIDOCAINE HCL (PF) 1 % IJ SOLN
30.0000 mL | INTRAMUSCULAR | Status: AC | PRN
  Administered 2018-05-15: 30 mL via SUBCUTANEOUS
  Filled 2018-05-15: qty 30

## 2018-05-15 MED ORDER — ONDANSETRON HCL 4 MG PO TABS: 4.0000 mg | ORAL_TABLET | ORAL | Status: DC | PRN

## 2018-05-15 MED ORDER — PRENATAL MULTIVITAMIN CH
1.0000 | ORAL_TABLET | Freq: Every day | ORAL | Status: DC
  Administered 2018-05-16 – 2018-05-17 (×2): 1 via ORAL
  Filled 2018-05-15 (×2): qty 1

## 2018-05-15 MED ORDER — ACETAMINOPHEN 325 MG PO TABS: 650.0000 mg | ORAL_TABLET | ORAL | Status: DC | PRN

## 2018-05-15 MED ORDER — LACTATED RINGERS IV SOLN: 500.0000 mL | INTRAVENOUS | Status: DC | PRN

## 2018-05-15 MED ORDER — DIPHENHYDRAMINE HCL 25 MG PO CAPS: 25.0000 mg | ORAL_CAPSULE | Freq: Four times a day (QID) | ORAL | Status: DC | PRN

## 2018-05-15 MED ORDER — ONDANSETRON HCL 4 MG/2ML IJ SOLN: 4.0000 mg | Freq: Four times a day (QID) | INTRAMUSCULAR | Status: DC | PRN

## 2018-05-15 MED ORDER — SENNOSIDES-DOCUSATE SODIUM 8.6-50 MG PO TABS
2.0000 | ORAL_TABLET | ORAL | Status: DC
  Administered 2018-05-15 – 2018-05-16 (×2): 2 via ORAL
  Filled 2018-05-15 (×2): qty 2

## 2018-05-15 MED ORDER — ZOLPIDEM TARTRATE 5 MG PO TABS: 5.0000 mg | ORAL_TABLET | Freq: Every evening | ORAL | Status: DC | PRN

## 2018-05-15 MED ORDER — IBUPROFEN 600 MG PO TABS
600.0000 mg | ORAL_TABLET | Freq: Four times a day (QID) | ORAL | Status: DC
  Administered 2018-05-15 – 2018-05-17 (×7): 600 mg via ORAL
  Filled 2018-05-15 (×7): qty 1

## 2018-05-15 NOTE — Progress Notes (Signed)
Pt back in bed.

## 2018-05-15 NOTE — Discharge Summary (Signed)
Postpartum Discharge Summary  Patient Name: Amanda Caldwell DOB: [birth year] MRN: [id number]  Date of admission: 05/15/2018 Delivering Provider: Sharen Counter A   Date of discharge: 05/16/2018  Admitting diagnosis: 38wks, contractions Intrauterine pregnancy: 110w4d     Secondary diagnosis:  Active Problems:   Normal labor   NSVD (normal spontaneous vaginal delivery)   Discharge diagnosis: Term Pregnancy Delivered                                     Postpartum procedures:none Augmentation: n/a Complications: None  Hospital course:  Onset of Labor With Vaginal Delivery     28 y.o. yo G1P1001 at [redacted]w[redacted]d was admitted in Active Labor on 05/15/2018. Patient had an uncomplicated labor course as follows:  Membrane Rupture Time/Date: 11:45 PM ,05/14/2018   Intrapartum Procedures: Episiotomy: None [1]                                         Lacerations:  2nd degree [3];Perineal [11]  Patient had a delivery of a Viable infant. 05/15/2018  Information for the patient's newborn:  Zekia, Grahek [[id number]]  Delivery Method: Vag-Spont   Patient had an uncomplicated postpartum course.  She is ambulating, tolerating a regular diet, passing flatus, and urinating well. Patient is discharged home in stable condition on 05/16/18.  Magnesium Sulfate recieved: No BMZ received: No  Physical exam  Vitals:   05/15/18 1556 05/15/18 1957 05/15/18 2345 05/16/18 0400  BP: 134/88 (!) 130/97 119/90 108/72  Pulse: 89 89 87 73  Resp: 18 18 18 18   Temp: 98.1 F (36.7 C) 98.6 F (37 C) 98.2 F (36.8 C) 97.8 F (36.6 C)  TempSrc: Axillary Axillary Oral Oral  SpO2:  99% 99% 98%   General: alert, cooperative and no distress  Lochia: appropriate Uterine Fundus: firm Incision: N/A DVT Evaluation: No evidence of DVT seen on physical exam  Labs: Lab Results  Component Value Date   WBC 11.8 (H) 03/15/2018   HGB 11.3 03/15/2018   HCT 34.3 03/15/2018   MCV 87 03/15/2018   PLT 279 03/15/2018   No  flowsheet data found.  Discharge instruction: per After Visit Summary and "Baby and Me Booklet".  After visit meds:  Allergies as of 05/16/2018      Reactions   Amoxicillin Swelling      Medication List    STOP taking these medications   terconazole 0.4 % vaginal cream Commonly known as:  TERAZOL 7     TAKE these medications   ibuprofen 800 MG tablet Commonly known as:  ADVIL,MOTRIN Take 1 tablet (800 mg total) by mouth 3 (three) times daily.   multivitamin-prenatal 27-0.8 MG Tabs tablet Take 1 tablet by mouth daily at 12 noon.   senna-docusate 8.6-50 MG tablet Commonly known as:  Senokot-S Take 2 tablets by mouth at bedtime as needed for mild constipation.      Diet: routine diet Activity: Advance as tolerated. Pelvic rest for 6 weeks.   Outpatient follow up:6 weeks Follow up Appt: Future Appointments  Date Time Provider Department Center  05/25/2018  3:35 PM Marylene Land, CNM [facility]   Follow up Visit: Follow-up Information    Center for [facility]. Schedule an appointment as soon as possible for a visit.   Specialty:  Obstetrics and Gynecology Why:  You should receive a call to schedule a postpartum follow-up visit. If you do not, please call clinic to make a visit in 4-6 weeks.  Contact information: [address] [place] [address] [phone]        Please schedule this patient for Postpartum visit in: 6 weeks with the following provider: APP For C/S patients schedule nurse incision check in weeks 2 weeks: no Low risk pregnancy complicated by: none Delivery mode:  SVD Anticipated Birth Control:  natural family planning PP Procedures needed: n/a  Schedule Integrated BH visit: no  Newborn Data: Live born female  Birth Weight: 7 lb 9.5 oz (3445 g) APGAR: 7, 9  Newborn Delivery   Birth date/time:  05/15/2018 12:35:00 Delivery type:  Vaginal, Spontaneous    Baby Feeding: Breast Disposition:home with  mother  05/16/2018 Tamera Stands, DO

## 2018-05-15 NOTE — Lactation Note (Signed)
This note was copied from a baby's chart. Lactation Consultation Note  Patient Name: Amanda Caldwell Today's Date: 05/15/2018 Reason for consult: Initial assessment;Primapara;1st time breastfeeding;Early term 69-38.6wks  9 hours old early term female who is being exclusively BF by his mother, she's a P1. Mom is already familiar with hand expression, when LC revised hand expression with mom she was able to get a small droplet out of her left breast, LC finger fed baby. Noticed that baby had a very tight grip, he would bite/clamp on a gloved finger. Mom has a Spectra DEBP that she brought to the hospital.  Offered assistance with latch, mom voiced that baby just finished eating but that she can try again, since he's just been sitting on her nipple for the last 5 minutes. LC woke baby up, and took him STS to mom's left breast in football position but unable to latch at this time. LC tried suck training, massaging his cheeks to elicit sucking but baby wasn't interesting in feeding at this point. An attempt was documented in Flowsheets. Reviewed normal newborn behavior, cluster feeding, feeding cues and jaundice, baby is bruised on the head.  Feeding plan:  1. Encouraged mom to keep feeding baby 8-12 times/24 hours or sooner if feeding cues are present.  2. Hand expression and spoon/finger feeding was strongly encouraged  BF brochure and feeding diary were reviewed. Parents reported all questions and concerns were answered, they're both aware of LC OP services and will call PRN.  Maternal Data Formula Feeding for Exclusion: No Has patient been taught Hand Expression?: Yes Does the patient have breastfeeding experience prior to this delivery?: No  Feeding Feeding Type: Breast Fed   Interventions Interventions: Breast feeding basics reviewed;Assisted with latch;Skin to skin;Breast massage;Hand express;Breast compression;Adjust position;Support pillows  Lactation Tools Discussed/Used WIC  Program: No   Consult Status Consult Status: Follow-up Date: 05/16/18 Follow-up type: In-patient    Laelle Bridgett Venetia Constable 05/15/2018, 9:48 PM

## 2018-05-15 NOTE — MAU Note (Addendum)
Amanda Caldwell is a 28 y.o. at [redacted]w[redacted]d here in MAU reporting: contractions and LOF Onset of complaint: 1145 last night she noticed LOF; contractions started to become regular around 3:28am Vitals:   05/15/18 1027  BP: 127/80  Pulse: (!) 56  Resp: 20  Temp: 98.1 F (36.7 C)     FHT: 140's Patient complete upon arrival; [place] CNM states patient able to transport to LD bed; patient taken to admit bed on stretcher with RN and CNM bedside.

## 2018-05-15 NOTE — Progress Notes (Signed)
Pt in bathroom pushing while on toliet

## 2018-05-15 NOTE — H&P (Signed)
Amanda Caldwell is a 28 y.o. female G1P0 @[redacted]w[redacted]d  presenting for active labor at term.   Pregnancy has been uncomplicated. She was planning a waterbirth and desires natural labor.    Nursing Staff Provider  Office Location  [facility] Dating  8 wk sono   Language  English Anatomy US  Normal   Flu Vaccine  12/17/17 Genetic Screen  NIPS: declined  AFP: declined   TDaP vaccine   03/15/2018 Hgb A1C or  GTT Third trimester: nml   Rhogam  N/A   LAB RESULTS   Feeding Plan Breast Blood Type A/Positive/-- (09/05 0000)   Contraception Natural planning  Antibody  Negative   Circumcision  Rubella Immune (09/05 0000)  Pediatrician  Not sure  RPR   Non reactive   Support Person Thomas HBsAg Negative (09/05 0000)   Prenatal Classes  Yes HIV Non-reactive (09/05 0000)  BTL Consent N/A GBS  (For PCN allergy, check sensitivities)  GBS neg  VBAC Consent N/A Pap Normal 2017    Hgb Electro  Declined     CF Declined     SMA Declined     Waterbirth  [x]  Class [x]  Consent [x]  CNM visit   OB History    Gravida  1   Para      Term      Preterm      AB      Living        SAB      TAB      Ectopic      Multiple      Live Births             History reviewed. No pertinent past medical history. History reviewed. No pertinent surgical history. Family History: family history is not on file. Social History:  has no history on file for tobacco, alcohol, and drug.     Maternal Diabetes: No Genetic Screening: Declined Maternal Ultrasounds/Referrals: Normal Fetal Ultrasounds or other Referrals:  None Maternal Substance Abuse:  No Significant Maternal Medications:  None Significant Maternal Lab Results:  Lab values include: Group B Strep negative Other Comments:  None  Review of Systems  Constitutional: Negative for chills and fever.  Respiratory: Negative for shortness of breath.   Cardiovascular: Negative for chest pain.  Gastrointestinal: Positive for abdominal pain. Negative for  constipation, diarrhea and vomiting.  Musculoskeletal: Positive for back pain.  Neurological: Negative for dizziness and headaches.  All other systems reviewed and are negative.  Maternal Medical History:  Reason for admission: Rupture of membranes and contractions.   Contractions: Onset was 6-12 hours ago.   Frequency: regular.   Perceived severity is strong.    Fetal activity: Perceived fetal activity is normal.   Last perceived fetal movement was within the past hour.    Prenatal complications: no prenatal complications Prenatal Complications - Diabetes: none.    Dilation: 10 Effacement (%): 100 Station: 0 Exam by:: Janeth Rase RNC Blood pressure 116/89, pulse 85, temperature 98.1 F (36.7 C), temperature source Oral, resp. rate 18, last menstrual period 07/16/2017. Maternal Exam:  Uterine Assessment: Contraction strength is firm.  Contraction duration is 1 minute. Contraction frequency is regular.   Abdomen: Patient reports no abdominal tenderness. Fetal presentation: vertex  Introitus: Amniotic fluid character: clear.  Cervix: Cervix evaluated by digital exam.     Fetal Exam Fetal Monitor Review: Mode: ultrasound.   Baseline rate: 135.  Variability: moderate (6-25 bpm).   Pattern: accelerations present and variable decelerations.  Fetal State Assessment: Category II - tracings are indeterminate.     Physical Exam  Nursing note and vitals reviewed. Constitutional: She is oriented to person, place, and time. She appears well-developed and well-nourished.  Neck: Normal range of motion.  Cardiovascular: Normal rate, regular rhythm and normal heart sounds.  Respiratory: Effort normal and breath sounds normal.  GI: Soft.  Musculoskeletal: Normal range of motion.  Neurological: She is alert and oriented to person, place, and time.  Skin: Skin is warm and dry.  Psychiatric: She has a normal mood and affect. Her behavior is normal. Judgment and thought  content normal.    Prenatal labs: ABO, Rh: A/Positive/-- (09/05 0000) Antibody:  Negative Rubella: Immune (09/05 0000) RPR: Non Reactive (02/03 0843)  HBsAg: Negative (09/05 0000)  HIV: Non Reactive (02/03 0843)  GBS:   Negative  Assessment/Plan: G1P0@[redacted]w[redacted]d  in active labor at term GBS negative  Admit to Labor and Delivery Pt desires natural birth, discussed plans for skin to skin, delayed cord clamping. Pt declines eye ointment but desires Vitamin K for newborn.   Anticipate NSVD  Sharen Counter 05/15/2018, 11:03 AM

## 2018-05-16 LAB — RPR: RPR Ser Ql: NONREACTIVE

## 2018-05-16 MED ORDER — SENNOSIDES-DOCUSATE SODIUM 8.6-50 MG PO TABS: 2.0000 | ORAL_TABLET | Freq: Every evening | ORAL | Status: DC | PRN

## 2018-05-16 MED ORDER — IBUPROFEN 800 MG PO TABS: 800.0000 mg | ORAL_TABLET | Freq: Three times a day (TID) | ORAL | 0 refills | Status: DC

## 2018-05-16 NOTE — Lactation Note (Signed)
This note was copied from a baby's chart. Lactation Consultation Note  Patient Name: Amanda Caldwell [id number] Date: 05/16/2018 Reason for consult: Follow-up assessment;Primapara;1st time breastfeeding;Early term 42-38.6wks  Visited with P1 Mom of ET infant at 42 hrs old.  Baby at 2% weight loss with 2 voids and 2 stools.  Mom desires to be discharged today.  Mom very eager to learn and was asking lots of questions.  Mom denies any difficulty with latching baby, but did say that this am, was first time he latched and fed consistently for 20 mins.  Reviewed normal newborn behavior in first days of life.    Assisted Mom with football hold on left breast.  Reviewed breast massage and hand expression, colostrum drops noted.  Baby sleepy initially, had a good size transitional stool.  Changed him and he was much more interested in latching to breast.    Swallows identified for Mom.  Showed FOB how to tug on chin to flange lower lip.  Showed Mom how to use alternate breast compression during feeding to increase milk transfer.    Encouraged keeping baby STS as much as possible, and watch for feeding cues.  Goal is >8 feedings per 24 hrs.  Engorgement prevention and treatment reviewed.  Mom has a Spectra DEBP for home use.  Mom aware of OP lactation support available, and encouraged her to call prn.  Feeding Feeding Type: Breast Fed  LATCH Score Latch: Grasps breast easily, tongue down, lips flanged, rhythmical sucking.  Audible Swallowing: Spontaneous and intermittent  Type of Nipple: Everted at rest and after stimulation  Comfort (Breast/Nipple): Soft / non-tender  Hold (Positioning): No assistance needed to correctly position infant at breast.  LATCH Score: 10  Interventions Interventions: Breast feeding basics reviewed;Assisted with latch;Skin to skin;Breast massage;Breast compression;Adjust position;Support pillows;Position options   Consult Status Consult Status: Complete Date:  05/17/18 Follow-up type: Call as needed    [name] 05/16/2018, 12:34 PM

## 2018-05-17 MED ORDER — MEDROXYPROGESTERONE ACETATE 150 MG/ML IM SUSP
150.0000 mg | Freq: Once | INTRAMUSCULAR | Status: AC
  Administered 2018-05-17: 15:00:00 150 mg via INTRAMUSCULAR
  Filled 2018-05-17: qty 1

## 2018-05-17 MED ORDER — MEDROXYPROGESTERONE ACETATE 150 MG/ML IM SUSP: 150.0000 mg | Freq: Once | INTRAMUSCULAR | Status: DC

## 2018-05-17 NOTE — Lactation Note (Signed)
This note was copied from a baby's chart. Lactation Consultation Note  Patient Name: Boy Mylisha Sanantonio [id number] Date: 05/17/2018 Reason for consult: Follow-up assessment   P1, Baby 44 hours old.  Latched in cross cradle hold with pillow upon entering. Intermittent swallows observed. Encouraged mother to support her breast, compress and breastfeed on both breasts per feeding. Reviewed engorgement care and monitoring voids/stools. Answered questions about pumping and going back to work. Feed on demand approximately 8-12 times per day.      Maternal Data    Feeding Feeding Type: Breast Fed  LATCH Score Latch: Grasps breast easily, tongue down, lips flanged, rhythmical sucking.  Audible Swallowing: A few with stimulation  Type of Nipple: Everted at rest and after stimulation  Comfort (Breast/Nipple): Soft / non-tender  Hold (Positioning): No assistance needed to correctly position infant at breast.  LATCH Score: 9  Interventions Interventions: Breast feeding basics reviewed  Lactation Tools Discussed/Used     Consult Status Consult Status: Complete Date: 05/17/18    Dahlia Byes [facility] 05/17/2018, 9:33 AM

## 2018-05-17 NOTE — Progress Notes (Signed)
Patient ID: Amanda Caldwell, female   DOB: 05-17-1990, 28 y.o.   MRN: [id number] Attending Circumcision Counseling Progress Note  Patient desires circumcision for her female infant.  Circumcision procedure details discussed, risks and benefits of procedure were also discussed.  These include but are not limited to: Benefits of circumcision in men include reduction in the rates of urinary tract infection (UTI), penile cancer, some sexually transmitted infections, penile inflammatory and retractile disorders, as well as easier hygiene.  Risks include bleeding , infection, injury of glans which may lead to penile deformity or urinary tract issues, unsatisfactory cosmetic appearance and other potential complications related to the procedure.  It was emphasized that this is an elective procedure.  Patient wants to proceed with circumcision; written informed consent obtained.  Will do circumcision soon, routine circumcision and post circumcision care ordered for the infant.  Braidan Ricciardi L. Alysia Penna, M.D. 05/17/2018 12:27 PM

## 2018-05-17 NOTE — Discharge Instructions (Signed)
Vaginal Delivery, Care After °Refer to this sheet in the next few weeks. These instructions provide you with information about caring for yourself after vaginal delivery. Your health care provider may also give you more specific instructions. Your treatment has been planned according to current medical practices, but problems sometimes occur. Call your health care provider if you have any problems or questions. °What can I expect after the procedure? °After vaginal delivery, it is common to have: °· Some bleeding from your vagina. °· Soreness in your abdomen, your vagina, and the area of skin between your vaginal opening and your anus (perineum). °· Pelvic cramps. °· Fatigue. °Follow these instructions at home: °Medicines °· Take over-the-counter and prescription medicines only as told by your health care provider. °· If you were prescribed an antibiotic medicine, take it as told by your health care provider. Do not stop taking the antibiotic until it is finished. °Driving ° °· Do not drive or operate heavy machinery while taking prescription pain medicine. °· Do not drive for 24 hours if you received a sedative. °Lifestyle °· Do not drink alcohol. This is especially important if you are breastfeeding or taking medicine to relieve pain. °· Do not use tobacco products, including cigarettes, chewing tobacco, or e-cigarettes. If you need help quitting, ask your health care provider. °Eating and drinking °· Drink at least 8 eight-ounce glasses of water every day unless you are told not to by your health care provider. If you choose to breastfeed your baby, you may need to drink more water than this. °· Eat high-fiber foods every day. These foods may help prevent or relieve constipation. High-fiber foods include: °? Whole grain cereals and breads. °? Brown rice. °? Beans. °? Fresh fruits and vegetables. °Activity °· Return to your normal activities as told by your health care provider. Ask your health care provider what  activities are safe for you. °· Rest as much as possible. Try to rest or take a nap when your baby is sleeping. °· Do not lift anything that is heavier than your baby or 10 lb (4.5 kg) until your health care provider says that it is safe. °· Talk with your health care provider about when you can engage in sexual activity. This may depend on your: °? Risk of infection. °? Rate of healing. °? Comfort and desire to engage in sexual activity. °Vaginal Care °· If you have an episiotomy or a vaginal tear, check the area every day for signs of infection. Check for: °? More redness, swelling, or pain. °? More fluid or blood. °? Warmth. °? Pus or a bad smell. °· Do not use tampons or douches until your health care provider says this is safe. °· Watch for any blood clots that may pass from your vagina. These may look like clumps of dark red, brown, or black discharge. °General instructions °· Keep your perineum clean and dry as told by your health care provider. °· Wear loose, comfortable clothing. °· Wipe from front to back when you use the toilet. °· Ask your health care provider if you can shower or take a bath. If you had an episiotomy or a perineal tear during labor and delivery, your health care provider may tell you not to take baths for a certain length of time. °· Wear a bra that supports your breasts and fits you well. °· If possible, have someone help you with household activities and help care for your baby for at least a few days after you   leave the hospital. °· Keep all follow-up visits for you and your baby as told by your health care provider. This is important. °Contact a health care provider if: °· You have: °? Vaginal discharge that has a bad smell. °? Difficulty urinating. °? Pain when urinating. °? A sudden increase or decrease in the frequency of your bowel movements. °? More redness, swelling, or pain around your episiotomy or vaginal tear. °? More fluid or blood coming from your episiotomy or vaginal  tear. °? Pus or a bad smell coming from your episiotomy or vaginal tear. °? A fever. °? A rash. °? Little or no interest in activities you used to enjoy. °? Questions about caring for yourself or your baby. °· Your episiotomy or vaginal tear feels warm to the touch. °· Your episiotomy or vaginal tear is separating or does not appear to be healing. °· Your breasts are painful, hard, or turn red. °· You feel unusually sad or worried. °· You feel nauseous or you vomit. °· You pass large blood clots from your vagina. If you pass a blood clot from your vagina, save it to show to your health care provider. Do not flush blood clots down the toilet without having your health care provider look at them. °· You urinate more than usual. °· You are dizzy or light-headed. °· You have not breastfed at all and you have not had a menstrual period for 12 weeks after delivery. °· You have stopped breastfeeding and you have not had a menstrual period for 12 weeks after you stopped breastfeeding. °Get help right away if: °· You have: °? Pain that does not go away or does not get better with medicine. °? Chest pain. °? Difficulty breathing. °? Blurred vision or spots in your vision. °? Thoughts about hurting yourself or your baby. °· You develop pain in your abdomen or in one of your legs. °· You develop a severe headache. °· You faint. °· You bleed from your vagina so much that you fill two sanitary pads in one hour. °This information is not intended to replace advice given to you by your health care provider. Make sure you discuss any questions you have with your health care provider. °Document Released: 01/25/2000 Document Revised: 07/11/2015 Document Reviewed: 02/11/2015 °Elsevier Interactive Patient Education © 2019 Elsevier Inc. ° °

## 2018-05-19 ENCOUNTER — Telehealth: Payer: Self-pay | Admitting: Obstetrics & Gynecology

## 2018-05-19 NOTE — Telephone Encounter (Signed)
Amanda Caldwell requested the patient be scheduled for nst/bpp on the week of the 14th.

## 2018-05-25 ENCOUNTER — Inpatient Hospital Stay (HOSPITAL_COMMUNITY): Admit: 2018-05-25 | Payer: Self-pay

## 2018-05-25 ENCOUNTER — Encounter: Payer: BLUE CROSS/BLUE SHIELD | Admitting: Student

## 2018-05-25 ENCOUNTER — Telehealth: Payer: Self-pay | Admitting: Obstetrics & Gynecology

## 2018-05-25 NOTE — Telephone Encounter (Signed)
Called the patient to inform of upcoming appointment. No questions at this time.

## 2018-05-26 ENCOUNTER — Other Ambulatory Visit: Payer: BLUE CROSS/BLUE SHIELD

## 2018-06-11 ENCOUNTER — Telehealth: Payer: Self-pay | Admitting: Obstetrics and Gynecology

## 2018-06-11 NOTE — Telephone Encounter (Signed)
Called in to have an appointment with Crisoforo Oxford however Crisoforo Oxford isn't scheduled for virtual visits for the entire month of May. The patient stated she will leave the appointment as is. We also reviewed the [employer] webex, she stated she has the app downloaded on her computer.

## 2018-06-15 ENCOUNTER — Ambulatory Visit (INDEPENDENT_AMBULATORY_CARE_PROVIDER_SITE_OTHER): Payer: BLUE CROSS/BLUE SHIELD

## 2018-06-15 NOTE — Progress Notes (Signed)
    TELEHEALTH VIRTUAL POSTPARTUM VISIT ENCOUNTER NOTE  I connected with [name] on 06/15/18 at  8:55 AM EDT by WebEx at home and verified that I am speaking with the correct person using two identifiers.   I discussed the limitations, risks, security and privacy concerns of performing an evaluation and management service by telephone and the availability of in person appointments. I also discussed with the patient that there may be a patient responsible charge related to this service. The patient expressed understanding and agreed to proceed.  Appointment Date: 06/15/2018  [facility]  Chief Complaint:  Chief Complaint  Patient presents with  . Postpartum Care    History of Present Illness: Amanda Caldwell is a 28 y.o. Caucasian G1P1001 (Patient's last menstrual period was 07/16/2017 (exact date).), seen for the above chief complaint. Her past medical history is significant for n/a   She is s/p normal spontaneous vaginal delivery on 05/15/18 at 38 weeks; she was discharged to home on PPD#1. Pregnancy complicated by n/a. Baby is doing well.  Complains of none  Vaginal bleeding or discharge: No  Mode of feeding infant: Breast Intercourse: No  Contraception: IUD PP depression s/s: No .  Any bowel or bladder issues: No  Pap smear: no abnormalities (date: March 2019 at [facility])  Review of Systems: Positive for nothing. Her 12 point review of systems is negative or as noted in the History of Present Illness.  Patient Active Problem List   Diagnosis Date Noted  . Normal labor 05/15/2018  . NSVD (normal spontaneous vaginal delivery) 05/15/2018  . Supervision of other normal pregnancy, antepartum 12/17/2017    Medications Mettie R. Enge had no medications administered during this visit. Current Outpatient Medications  Medication Sig Dispense Refill  . Prenatal Vit-Fe Fumarate-FA (MULTIVITAMIN-PRENATAL) 27-0.8 MG TABS tablet Take 1 tablet by mouth daily at 12 noon.     Marland Kitchen ibuprofen (ADVIL,MOTRIN) 800 MG tablet Take 1 tablet (800 mg total) by mouth 3 (three) times daily. (Patient not taking: Reported on 06/15/2018) 30 tablet 0  . senna-docusate (SENOKOT-S) 8.6-50 MG tablet Take 2 tablets by mouth at bedtime as needed for mild constipation. (Patient not taking: Reported on 06/15/2018)     Current Facility-Administered Medications  Medication Dose Route Frequency Provider Last Rate Last Dose  . medroxyPROGESTERone (DEPO-PROVERA) injection 150 mg  150 mg Intramuscular Once Hermina Staggers, MD        Allergies Amoxicillin  Physical Exam:  General:  Alert, oriented and cooperative.   Mental Status: Normal mood and affect perceived. Normal judgment and thought content.  Rest of physical exam deferred due to type of encounter  PP Depression Screening:    Assessment:Patient is a 28 y.o. G1P1001 who is 4 weeks postpartum from a normal spontaneous vaginal delivery.  She is doing well.   Plan:  1. Postpartum care and examination     RTC 8 weeks for next Depo or IUD insertion  I discussed the assessment and treatment plan with the patient. The patient was provided an opportunity to ask questions and all were answered. The patient agreed with the plan and demonstrated an understanding of the instructions.   The patient was advised to call back or seek an in-person evaluation/go to the ED for any concerning postpartum symptoms.  I provided 35 minutes of non-face-to-face time during this encounter.   Rolm Bookbinder, CNM Center for [employer], [facility]

## 2018-06-15 NOTE — Progress Notes (Signed)
I connected with  Amanda Caldwell on 06/15/18 at  8:55 AM EDT by telephone and verified that I am speaking with the correct person using two identifiers.   I discussed the limitations, risks, security and privacy concerns of performing an evaluation and management service by telephone and the availability of in person appointments. I also discussed with the patient that there may be a patient responsible charge related to this service. The patient expressed understanding and agreed to proceed.  Janene Madeira Elmira Olkowski, CMA 06/15/2018  8:30 AM

## 2018-10-12 ENCOUNTER — Telehealth: Payer: Self-pay

## 2018-10-12 NOTE — Telephone Encounter (Signed)
Patient called in and was wondering if she could continue to take her prenatal vitamins 5 months postpartum. Advised yes because she is still exclusively breastfeeding. Pt verbalized understanding and ended the call.

## 2019-11-07 ENCOUNTER — Other Ambulatory Visit: Payer: Self-pay

## 2019-11-07 ENCOUNTER — Ambulatory Visit (INDEPENDENT_AMBULATORY_CARE_PROVIDER_SITE_OTHER): Payer: No Typology Code available for payment source | Admitting: General Practice

## 2019-11-07 DIAGNOSIS — Z3201 Encounter for pregnancy test, result positive: Secondary | ICD-10-CM

## 2019-11-07 LAB — POCT PREGNANCY, URINE: Preg Test, Ur: POSITIVE — AB

## 2019-11-07 NOTE — Progress Notes (Signed)
Patient came into the office today to drop off urine for UPT. UPT +.   Called patient and she reports first positive home test September 19th. LMP 08/26/19 EDD 06/01/20 [redacted]w[redacted]d. Patient denies taking any meds only a "postnatal vitamin". She reports occasional nausea & feeling lightheaded but nothing severe. Advised patient to begin taking PNV and start OB care.   Chase Caller RN BSN 11/07/19

## 2019-11-24 ENCOUNTER — Other Ambulatory Visit: Payer: Self-pay

## 2019-11-24 ENCOUNTER — Encounter: Payer: Self-pay | Admitting: Student

## 2019-11-24 ENCOUNTER — Ambulatory Visit (INDEPENDENT_AMBULATORY_CARE_PROVIDER_SITE_OTHER): Payer: No Typology Code available for payment source | Admitting: Student

## 2019-11-24 DIAGNOSIS — O09899 Supervision of other high risk pregnancies, unspecified trimester: Secondary | ICD-10-CM

## 2019-11-24 DIAGNOSIS — Z3480 Encounter for supervision of other normal pregnancy, unspecified trimester: Secondary | ICD-10-CM | POA: Diagnosis not present

## 2019-11-24 NOTE — Progress Notes (Signed)
  Subjective:    Amanda Caldwell is being seen today for her first obstetrical visit.  This is not a planned pregnancy. She is at [redacted]w[redacted]d gestation. Her obstetrical history is significant for Nothing. . Relationship with FOB: spouse, living together. Patient does intend to breast feed. Pregnancy history fully reviewed.  Patient reports no complaints.  Review of Systems:   Review of Systems  Constitutional: Negative.   HENT: Negative.   Respiratory: Negative.   Cardiovascular: Negative.   Gastrointestinal: Negative.   Genitourinary: Negative.   Neurological: Negative.     Objective:     BP 112/81   Pulse 89   Wt 138 lb 9.6 oz (62.9 kg)   LMP 08/26/2019   BMI 23.06 kg/m  Physical Exam HENT:     Head: Normocephalic.  Musculoskeletal:        General: Normal range of motion.  Skin:    General: Skin is warm and dry.  Neurological:     Mental Status: She is alert.     Exam    Assessment:    Pregnancy: G2P1001 Patient Active Problem List   Diagnosis Date Noted  . Supervision of other normal pregnancy, antepartum 11/24/2019       Plan:     Prenatal vitamins. Problem list reviewed and updated. Patient is interested in alternative options to hospital birthing. Long discussion with patient about homebirth, birth center in [place], and continuing care at [facility]. Patient declined labs and [place] today and will consider options and will make new appointment when she has decided on birth setting.   Charlesetta Garibaldi Heath Tesler 11/24/2019

## 2019-11-25 NOTE — Progress Notes (Signed)
Chart reviewed for nurse visit. Agree with plan of care.   Bryonna Sundby Lorraine, CNM 11/25/2019 10:14 AM   

## 2020-02-11 NOTE — L&D Delivery Note (Signed)
Patient was brought to [facility] via EMS following SVD at home at 1154 . She delivered her placenta while EMS crew was present. She was medically screened by MAU staff prior to being brought to L&D for assessment. On admission she is stable, bleeding is appropriate. She declines all lab work and newborn medication.  Newborn is pink and with vigorous cry, good latch on admission.   Postpartum Planning:  --Patient desires discharge home as soon after 24 hours as possible --Inpatient SW consult, patient aware and verbalizes understanding of indication --Patient requests postpartum appointment with [facility], message sent --Patient declines contraception, circumcision  Clayton Bibles, MSN, CNM Certified Nurse Midwife, [employer] for [employer], [facility] 05/24/20 3:09 PM

## 2020-02-21 ENCOUNTER — Other Ambulatory Visit: Payer: Self-pay | Admitting: *Deleted

## 2020-02-21 DIAGNOSIS — Z789 Other specified health status: Secondary | ICD-10-CM

## 2020-02-21 MED ORDER — BREAST PUMP MISC: 1.0000 | 0 refills | Status: DC | PRN

## 2020-05-24 ENCOUNTER — Encounter (HOSPITAL_COMMUNITY): Payer: Self-pay | Admitting: Obstetrics & Gynecology

## 2020-05-24 ENCOUNTER — Other Ambulatory Visit: Payer: Self-pay

## 2020-05-24 ENCOUNTER — Inpatient Hospital Stay (HOSPITAL_COMMUNITY)
Admission: AD | Admit: 2020-05-24 | Discharge: 2020-05-25 | DRG: 776 | Disposition: A | Discharge status: Home / Self Care | Payer: No Typology Code available for payment source | Attending: Obstetrics and Gynecology | Admitting: Obstetrics and Gynecology

## 2020-05-24 DIAGNOSIS — O0930 Supervision of pregnancy with insufficient antenatal care, unspecified trimester: Secondary | ICD-10-CM

## 2020-05-24 DIAGNOSIS — Z20822 Contact with and (suspected) exposure to covid-19: Secondary | ICD-10-CM | POA: Diagnosis present

## 2020-05-24 DIAGNOSIS — Z3A38 38 weeks gestation of pregnancy: Secondary | ICD-10-CM | POA: Diagnosis not present

## 2020-05-24 LAB — COMPREHENSIVE METABOLIC PANEL
ALT: 17 U/L (ref 0–44)
AST: 26 U/L (ref 15–41)
Albumin: 2.2 g/dL — ABNORMAL LOW (ref 3.5–5.0)
Alkaline Phosphatase: 110 U/L (ref 38–126)
Anion gap: 10 (ref 5–15)
BUN: <5 mg/dL — ABNORMAL LOW (ref 6–20)
CO2: 21 mmol/L — ABNORMAL LOW (ref 22–32)
Calcium: 8.2 mg/dL — ABNORMAL LOW (ref 8.9–10.3)
Chloride: 100 mmol/L (ref 98–111)
Creatinine, Ser: 0.63 mg/dL (ref 0.44–1.00)
GFR, Estimated: >60 mL/min (ref >60)
Glucose, Bld: 143 mg/dL — ABNORMAL HIGH (ref 70–99)
Potassium: 3.8 mmol/L (ref 3.5–5.1)
Sodium: 131 mmol/L — ABNORMAL LOW (ref 135–145)
Total Bilirubin: 0.9 mg/dL (ref 0.3–1.2)
Total Protein: 5.3 g/dL — ABNORMAL LOW (ref 6.5–8.1)

## 2020-05-24 LAB — HEMOGLOBIN A1C
Hgb A1c MFr Bld: 5.2 % (ref 4.8–5.6)
Mean Plasma Glucose: 102.54 mg/dL

## 2020-05-24 LAB — CBC
HCT: 32.4 % — ABNORMAL LOW (ref 36.0–46.0)
Hemoglobin: 10.5 g/dL — ABNORMAL LOW (ref 12.0–15.0)
MCH: 30.2 pg (ref 26.0–34.0)
MCHC: 32.4 g/dL (ref 30.0–36.0)
MCV: 93.1 fL (ref 80.0–100.0)
Platelets: 273 10*3/uL (ref 150–400)
RBC: 3.48 MIL/uL — ABNORMAL LOW (ref 3.87–5.11)
RDW: 13.2 % (ref 11.5–15.5)
WBC: 22.6 10*3/uL — ABNORMAL HIGH (ref 4.0–10.5)
nRBC: 0 % (ref 0.0–0.2)

## 2020-05-24 LAB — TYPE AND SCREEN
ABO/RH(D): A POS
Antibody Screen: NEGATIVE

## 2020-05-24 LAB — SARS CORONAVIRUS 2 (TAT 6-24 HRS): SARS Coronavirus 2: NEGATIVE

## 2020-05-24 LAB — HIV ANTIBODY (ROUTINE TESTING W REFLEX): HIV Screen 4th Generation wRfx: NONREACTIVE

## 2020-05-24 LAB — HEPATITIS C ANTIBODY: HCV Ab: NONREACTIVE

## 2020-05-24 MED ORDER — DIPHENHYDRAMINE HCL 25 MG PO CAPS: 25.0000 mg | ORAL_CAPSULE | Freq: Four times a day (QID) | ORAL | Status: DC | PRN

## 2020-05-24 MED ORDER — IBUPROFEN 600 MG PO TABS: 600.0000 mg | ORAL_TABLET | Freq: Four times a day (QID) | ORAL | Status: DC | PRN

## 2020-05-24 MED ORDER — ACETAMINOPHEN 325 MG PO TABS: 650.0000 mg | ORAL_TABLET | ORAL | Status: DC | PRN

## 2020-05-24 MED ORDER — COCONUT OIL OIL: 1.0000 "application " | TOPICAL_OIL | Status: DC | PRN

## 2020-05-24 MED ORDER — SIMETHICONE 80 MG PO CHEW: 80.0000 mg | CHEWABLE_TABLET | ORAL | Status: DC | PRN

## 2020-05-24 MED ORDER — BENZOCAINE-MENTHOL 20-0.5 % EX AERO: 1.0000 "application " | INHALATION_SPRAY | CUTANEOUS | Status: DC | PRN

## 2020-05-24 MED ORDER — WITCH HAZEL-GLYCERIN EX PADS: 1.0000 "application " | MEDICATED_PAD | CUTANEOUS | Status: DC | PRN

## 2020-05-24 MED ORDER — IBUPROFEN 600 MG PO TABS: 600.0000 mg | ORAL_TABLET | Freq: Four times a day (QID) | ORAL | Status: DC

## 2020-05-24 MED ORDER — ONDANSETRON HCL 4 MG/2ML IJ SOLN: 4.0000 mg | INTRAMUSCULAR | Status: DC | PRN

## 2020-05-24 MED ORDER — ONDANSETRON HCL 4 MG PO TABS: 4.0000 mg | ORAL_TABLET | ORAL | Status: DC | PRN

## 2020-05-24 MED ORDER — MAGNESIUM HYDROXIDE 400 MG/5ML PO SUSP: 30.0000 mL | ORAL | Status: DC | PRN

## 2020-05-24 MED ORDER — PRENATAL MULTIVITAMIN CH: 1.0000 | ORAL_TABLET | Freq: Every day | ORAL | Status: DC

## 2020-05-24 MED ORDER — DIBUCAINE (PERIANAL) 1 % EX OINT: 1.0000 "application " | TOPICAL_OINTMENT | CUTANEOUS | Status: DC | PRN

## 2020-05-24 NOTE — H&P (Signed)
Amanda Caldwell is a 30 y.o. female presenting for evaluation s/p SVD at home. Her placenta was delivered by EMS/Fire crew. On arrival she denies pain, heavy vaginal bleeding, SOB, weakness, fever. Patient endorses having her husband and doula at home with her for birth.   OB History    Gravida  2   Para  1   Term  1   Preterm      AB      Living  1     SAB      IAB      Ectopic      Multiple  0   Live Births  1          Past Medical History:  Diagnosis Date  . Medical history non-contributory    Past Surgical History:  Procedure Laterality Date  . NO PAST SURGERIES     Family History: family history is not on file. Social History:  reports that she has never smoked. She has never used smokeless tobacco. She reports that she does not drink alcohol and does not use drugs.    Maternal Diabetes: No Genetic Screening: Declined Maternal Ultrasounds/Referrals: Declined Fetal Ultrasounds or other Referrals:  None Maternal Substance Abuse:  No Significant Maternal Medications:  None Significant Maternal Lab Results:  None Other Comments:  None  Review of Systems  Gastrointestinal: Negative for abdominal pain.  Genitourinary: Positive for vaginal bleeding.  Musculoskeletal: Negative for back pain.  All other systems reviewed and are negative.    Last menstrual period 08/26/2019, unknown if currently breastfeeding. Physical Exam Vitals and nursing note reviewed. Exam conducted with a chaperone present.  Constitutional:      General: She is not in acute distress.    Appearance: Normal appearance. She is not ill-appearing.  Cardiovascular:     Rate and Rhythm: Normal rate.     Pulses: Normal pulses.  Pulmonary:     Effort: Pulmonary effort is normal.     Breath sounds: Normal breath sounds.  Abdominal:     Tenderness: There is no abdominal tenderness. There is no right CVA tenderness or left CVA tenderness.     Comments: Fundus firm  Skin:    Capillary  Refill: Capillary refill takes less than 2 seconds.  Neurological:     Mental Status: She is alert and oriented to person, place, and time.  Psychiatric:        Mood and Affect: Mood normal.        Behavior: Behavior normal.        Thought Content: Thought content normal.        Judgment: Judgment normal.     Prenatal labs: ABO, Rh:  A POS All other labs unknown, declined by patient  Assessment/Plan: --30 y.o. G2P1001 at [redacted]w[redacted]d  --S/p SVD at home at 1154 today --Delivery of placenta by Fire/EMS at 1235 --Prenatal care wt MCW x 1 visit on 11/24/2019 --Patient appropriate and cooperative, declining all labs on arrival --Bleeding controlled, fundus firm, no lacerations noted --Stable for transfer to Postpartum   Calvert Cantor, CNM 05/24/2020, 3:04 PM

## 2020-05-24 NOTE — Discharge Instructions (Signed)

## 2020-05-24 NOTE — Lactation Note (Signed)
This note was copied from a baby's chart. Lactation Consultation Note  Patient Name: Amanda Caldwell Today's Date: 05/24/2020 Reason for consult: Initial assessment;NICU baby;Early term 37-38.6wks Age:32 hours  Per mom, infant latched in NICU earlier today, mom is experienced at breastfeeding, her 1st child breastfeed for 15 months. LC entered the room, mom recently finished using the DEBP prior to [facility] entering the room. Mom knows to pump every 3 hours for 15 minutes on initial setting. LC reviewed hand expression and mom expressed 5 mls of colostrum from spoon into a curve tip syringe to be taken to NICU with labels , hospital  currently out of  collection bullets to store EBM. Mom will follow NICU infant feeding polices. Mom knows to call RN or LC if she has further questions or concerns about breastfeeding. Mom made aware of O/P services, breastfeeding support groups, community resources, and our phone # for post-discharge questions.  Maternal Data Has patient been taught Hand Expression?: Yes Does the patient have breastfeeding experience prior to this delivery?: Yes How long did the patient breastfeed?: Per mom, she breastfeed her two year old son for 15 months  Feeding Mother's Current Feeding Choice: Breast Milk  LATCH Score              Lactation Tools Discussed/Used Tools: Pump Breast pump type: Double-Electric Breast Pump Pump Education: Setup, frequency, and cleaning;Milk Storage Reason for Pumping: To help stimulate and establish mom's milk supply due infant seperation infant is currently in NICU. Pumping frequency: Mom will pump every 3 hours for 15 minutes on inital setting, mom finished pumping prior to [facility] entering the room.  Interventions Interventions: Breast feeding basics reviewed;Expressed milk;DEBP;Education  Discharge Pump: Personal;DEBP WIC Program: No  Consult Status Consult Status: Follow-up Date: 05/25/20 Follow-up type:  In-patient    [name] 05/24/2020, 11:57 PM

## 2020-05-24 NOTE — Lactation Note (Signed)
This note was copied from a baby's chart. Lactation Consultation Note  Patient Name: Amanda Caldwell Today's Date: 05/24/2020   Age:32 hours P2, ETI female infant. LC entered the room, mom not present in room  at this time, mom in NICU, LC will attempt to follow up with mom later today.  Maternal Data    Feeding    LATCH Score                    Lactation Tools Discussed/Used    Interventions    Discharge    Consult Status      [name] 05/24/2020, 7:08 PM

## 2020-05-24 NOTE — Discharge Summary (Signed)
Postpartum Discharge Summary       Patient Name: Amanda Caldwell DOB: [birth year] MRN: [id number]  Date of admission: 05/24/2020 Delivery date:05/24/2020  Delivering provider:   Date of discharge: 05/25/2020  Admitting diagnosis: Spontaneous vaginal delivery [O80] Intrauterine pregnancy: [redacted]w[redacted]d     Secondary diagnosis:  Active Problems:   Insufficient prenatal care   Spontaneous vaginal delivery  Additional problems: none    Discharge diagnosis: Term Pregnancy Delivered                                              Post partum procedures:N/A Augmentation: N/A Complications: None  Hospital course: Onset of Labor With Vaginal Delivery      30 y.o. yo G2P1001 at [redacted]w[redacted]d was admitted following vaginal birth at home on 05/24/2020. Patient had an uncomplicated labor course as follows:  Membrane Rupture Time/Date: 5:45 AM ,05/24/2020   Delivery Method:Vaginal, Spontaneous  Episiotomy: None  Lacerations:  None None Patient had an uncomplicated postpartum course.  She is ambulating, tolerating a regular diet, passing flatus, and urinating well. Patient is discharged home in stable condition on 05/25/20.  Newborn Data: Birth date:05/24/2020  Birth time:11:54 AM  Gender:Female  Living status:Living  Apgars:8 ,9  Weight:3255 g   Magnesium Sulfate received: No BMZ received: No Rhophylac:N/A MMR:No T-DaP:Declined Flu: No Transfusion:No  Physical exam  Vitals:   05/24/20 1632 05/24/20 1725 05/24/20 2250 05/25/20 0435  BP: 100/83 110/73 107/71 116/74  Pulse: (!) 105 (!) 116 (!) 112 93  Resp: $Remo'18 19 18 18  'WDJtv$ Temp: 98.1 F (36.7 C) 98.2 F (36.8 C) 98 F (36.7 C) 98.1 F (36.7 C)  TempSrc: Oral Oral Oral Oral  SpO2:  98% 97% 100%  Weight:      Height:       General: alert, cooperative and no distress Lochia: appropriate Uterine Fundus: firm Incision: N/A DVT Evaluation: No evidence of DVT seen on physical exam. Labs: Lab Results  Component Value Date   WBC 22.6 (H)  05/24/2020   HGB 10.5 (L) 05/24/2020   HCT 32.4 (L) 05/24/2020   MCV 93.1 05/24/2020   PLT 273 05/24/2020   CMP Latest Ref Rng & Units 05/24/2020  Glucose 70 - 99 mg/dL 143(H)  BUN 6 - 20 mg/dL <5(L)  Creatinine 0.44 - 1.00 mg/dL 0.63  Sodium 135 - 145 mmol/L 131(L)  Potassium 3.5 - 5.1 mmol/L 3.8  Chloride 98 - 111 mmol/L 100  CO2 22 - 32 mmol/L 21(L)  Calcium 8.9 - 10.3 mg/dL 8.2(L)  Total Protein 6.5 - 8.1 g/dL 5.3(L)  Total Bilirubin 0.3 - 1.2 mg/dL 0.9  Alkaline Phos 38 - 126 U/L 110  AST 15 - 41 U/L 26  ALT 0 - 44 U/L 17   Edinburgh Score: Edinburgh Postnatal Depression Scale Screening Tool 05/25/2020  I have been able to laugh and see the funny side of things. 0  I have looked forward with enjoyment to things. 0  I have blamed myself unnecessarily when things went wrong. 0  I have been anxious or worried for no good reason. 0  I have felt scared or panicky for no good reason. 0  Things have been getting on top of me. 1  I have been so unhappy that I have had difficulty sleeping. 0  I have felt sad or miserable. 0  I have been so unhappy  that I have been crying. 0  The thought of harming myself has occurred to me. 0  Edinburgh Postnatal Depression Scale Total 1     After visit meds:  Allergies as of 05/25/2020      Reactions   Amoxicillin Swelling      Medication List    STOP taking these medications   Breast Pump Misc   senna-docusate 8.6-50 MG tablet Commonly known as: Senokot-S     TAKE these medications   ibuprofen 600 MG tablet Commonly known as: ADVIL Take 1 tablet (600 mg total) by mouth every 6 (six) hours. What changed:   medication strength  how much to take  when to take this   multivitamin-prenatal 27-0.8 MG Tabs tablet Take 1 tablet by mouth daily at 12 noon.        Discharge home in stable condition Infant Feeding: Breast Infant Disposition:in NICU Discharge instruction: per After Visit Summary and Postpartum  booklet. Activity: Advance as tolerated. Pelvic rest for 6 weeks.  Diet: routine diet Future Appointments: Future Appointments  Date Time Provider Benson  06/28/2020  3:55 PM Starr Lake, CNM [facility]   Follow up Visit: Message sent by Maryelizabeth Kaufmann, CNM on 05/24/2020. Confirmed patient desires pp visit  Please schedule this patient for a Virtual postpartum visit in 4 weeks with the following provider: Prefers KK, CNM. Additional Postpartum F/U:Postpartum Depression checkup  Low risk pregnancy complicated by: Prenatal care x 1 visit, home birth Delivery mode:  Vaginal, Spontaneous  Anticipated Birth Control:  Declines   05/25/2020 Christin Fudge, CNM

## 2020-05-25 ENCOUNTER — Other Ambulatory Visit: Payer: Self-pay | Admitting: Obstetrics & Gynecology

## 2020-05-25 LAB — RPR: RPR Ser Ql: NONREACTIVE

## 2020-05-25 LAB — HEPATITIS B SURFACE ANTIBODY, QUANTITATIVE: Hep B S AB Quant (Post): 119.8 m[IU]/mL (ref >9.9)

## 2020-05-25 LAB — HEPATITIS B SURFACE ANTIGEN: Hepatitis B Surface Ag: NONREACTIVE

## 2020-05-25 MED ORDER — IBUPROFEN 600 MG PO TABS: 600.0000 mg | ORAL_TABLET | Freq: Four times a day (QID) | ORAL | 0 refills | Status: DC

## 2020-05-25 NOTE — Clinical Social Work Maternal (Signed)
CLINICAL SOCIAL WORK MATERNAL/CHILD NOTE  Patient Details  Name: Amanda Caldwell MRN: [id number] Date of Birth: 05/17/1990  Date:  05/25/2020  Clinical Social Worker Initiating Note:  Lynsee Wands, LCSW Date/Time: Initiated:  05/25/20/1218     Child's Name:  Jonah Miralles   Biological Parents:  Mother,Father (Father: Thomas "Tyler" Kressin Jr.)   Need for Interpreter:  None   Reason for Referral:  Other (Comment),Late or No Prenatal Care  (Limited prenatal care; Infant's NICU admission)   Address:  [address] [place] [address]    Phone number:  [phone] (home)     Additional phone number:   Household Members/Support Persons (HM/SP):   Household Member/Support Person 1,Household Member/Support Person 2   HM/SP Name Relationship DOB or Age  HM/SP -1 Thomas "Tyler" Rossin Jr. FOB/Husband    HM/SP -2 Elijah Barretto son 05/15/18  HM/SP -3        HM/SP -4        HM/SP -5        HM/SP -6        HM/SP -7        HM/SP -8          Natural Supports (not living in the home):  Immediate Family,Parent   Professional Supports: None   Employment: Full-time   Type of Work: Project Manager   Education:  College graduate   Homebound arranged:    Financial Resources:  Private Insurance   Other Resources:      Cultural/Religious Considerations Which May Impact Care:    Strengths:  Ability to meet basic needs ,Pediatrician chosen,Home prepared for child ,Understanding of illness   Psychotropic Medications:         Pediatrician:    [place] area  Pediatrician List:   [place]    [place] Other ([facility])  [place]    [place]    [place]    [place]      Pediatrician Fax Number:    Risk Factors/Current Problems:  None   Cognitive State:  Able to Concentrate ,Alert ,Insightful ,Goal Oriented ,Linear Thinking    Mood/Affect:  Comfortable ,Calm ,Happy ,Interested    CSW Assessment: CSW met with  MOB at infant's bedside to discuss limited prenatal care and infant's NICU admission, MOB's mother present. CSW introduced self and explained reason for consult. MOB granted CSW verbal permission to speak in front of MOB's mother about anything. MOB was welcoming, pleasant, open and remained engaged during assessment. MOB reported that she resides with FOB/Husband and older son. 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 MOB reported that they have all items needed to care for infant including a car seat and basinet. CSW inquired about MOB's support system, MOB reported the her mom and grandmother are supports.   CSW inquired about MOB's mental health history. MOB denied any mental health history. MOB denied any history of postpartum depression. CSW inquired about how MOB was feeling emotionally after giving birth, MOB reported that she was feeling traumatized. MOB informed CSW about her birth experience having infant at home and infant coming out purple. CSW acknowledged and validated MOB's feelings surrounding her birth experience. MOB presented calm and did not demonstrate any acute mental health signs/symptoms. CSW assessed for safety, MOB denied SI, HI and domestic violence.   CSW provided education regarding the baby blues period vs. perinatal mood disorders, discussed treatment and gave resources for mental health follow up if concerns arise.    CSW recommends self-evaluation during the postpartum time period using the New Mom Checklist from Postpartum Progress and encouraged MOB to contact a medical professional if symptoms are noted at any time.    CSW provided review of Sudden Infant Death Syndrome (SIDS) precautions.    CSW and MOB discussed infant's NICU admission. CSW informed MOB about the NICU, what to expect and resources/supports available while infant is admitted to the NICU. MOB reported that she feels well informed and this has been a great NICU experience. MOB  denied any transportation barriers with visiting infant in the NICU. MOB denied any questions/concerns regarding the NICU.   CSW informed MOB about the hospital drug screen policy due to limited to prenatal care. MOB confirmed limited prenatal care due to plan for having a natural home birth. CSW inquired about any substance use during pregnancy, MOB denied any substance use. CSW informed MOB that infant's UDS and CDS would be monitored and a CPS report would be made if warranted. MOB verbalized understanding and denied any questions/concerns.   MOB reported that she will contact CSW if any needs arise and declined regular check in's. CSW available if needed.   CSW identifies no further need for intervention and no barriers to discharge at this time.   CSW Plan/Description:  Sudden Infant Death Syndrome (SIDS) Education,Perinatal Mood and Anxiety Disorder (PMADs) Education,No Further Intervention Required/No Barriers to Discharge,Hospital Drug Screen Policy Information,CSW Will Continue to Monitor Umbilical Cord Tissue Drug Screen Results and Make Report if Warranted    Falecia Vannatter L Lenford Beddow, LCSW 05/25/2020, 12:23 PM 

## 2020-05-26 ENCOUNTER — Ambulatory Visit: Payer: Self-pay

## 2020-05-26 NOTE — Lactation Note (Signed)
This note was copied from a baby's chart. Lactation Consultation Note Mom continues to pump with increasing volume. She is breastfeeding ad lib with no latching difficulties. She asked to see [facility] today because she is concerned by baby's sleepiness. She is also concerned about nipple confusion with baby's need for supplementation. LC provided reassurance and encouragement. Suggested pumping prn for comfort and allowing baby to have lots of access to breast. Will plan f/u care prn.   Patient Name: Amanda Caldwell [id number] Date: 05/26/2020 Reason for consult: NICU baby;Follow-up assessment;Mother's request Age:26 hours  Feeding Mother's Current Feeding Choice: Breast Milk Nipple Type: Nfant Extra Slow Flow (gold)   Consult Status Consult Status: Follow-up Follow-up type: In-patient   Elder Negus, MA IBCLC 05/26/2020, 7:12 PM

## 2020-05-27 ENCOUNTER — Ambulatory Visit: Payer: Self-pay

## 2020-05-27 NOTE — Lactation Note (Signed)
This note was copied from a baby's chart. Lactation Consultation Note  Patient Name: Amanda Caldwell [id number] Date: 05/27/2020 Reason for consult: Follow-up assessment;NICU baby;Early term 37-38.6wks Age:32 hours  2% weight loss  LC in to visit with P2 Mom of ET infant in the NICU.    Baby has been latching baby to the breast and Mom reports no difficulty with latching baby.  Breasts are filling and Mom has been pumping larger volumes using the [employer] Symphony DEBP.    Baby to be held STS as much as possible and Mom to offer breast with cues.  Recommended OP lactation follow-up but Mom declined.  Mom has brochure with phone numbers for calling for concerns or to make an OP lactation.  Engorgement prevention and treatment reviewed.   Lactation Tools Discussed/Used Tools: Pump;Flanges Flange Size: 24 Breast pump type: Double-Electric Breast Pump  Interventions Interventions: Breast feeding basics reviewed;Skin to skin;Hand express;Breast massage;Education;DEBP  Discharge Discharge Education: Engorgement and breast care;Outpatient recommendation  Consult Status Consult Status: Complete (mother declined follow up) Date: 05/27/20 Follow-up type: Call as needed    [name] 05/27/2020, 10:56 AM

## 2020-06-28 ENCOUNTER — Telehealth: Payer: No Typology Code available for payment source | Admitting: Student

## 2021-04-14 ENCOUNTER — Emergency Department (HOSPITAL_BASED_OUTPATIENT_CLINIC_OR_DEPARTMENT_OTHER): Payer: Managed Care, Other (non HMO) | Admitting: Anesthesiology

## 2021-04-14 ENCOUNTER — Emergency Department (HOSPITAL_BASED_OUTPATIENT_CLINIC_OR_DEPARTMENT_OTHER): Payer: Managed Care, Other (non HMO)

## 2021-04-14 ENCOUNTER — Encounter (HOSPITAL_BASED_OUTPATIENT_CLINIC_OR_DEPARTMENT_OTHER): Payer: Self-pay | Admitting: Emergency Medicine

## 2021-04-14 ENCOUNTER — Other Ambulatory Visit: Payer: Self-pay

## 2021-04-14 ENCOUNTER — Observation Stay (HOSPITAL_BASED_OUTPATIENT_CLINIC_OR_DEPARTMENT_OTHER)
Admission: EM | Admit: 2021-04-14 | Discharge: 2021-04-15 | Disposition: A | Discharge status: Home / Self Care | Payer: Managed Care, Other (non HMO) | Attending: General Surgery | Admitting: General Surgery

## 2021-04-14 ENCOUNTER — Emergency Department (HOSPITAL_COMMUNITY): Payer: Managed Care, Other (non HMO) | Admitting: Anesthesiology

## 2021-04-14 ENCOUNTER — Emergency Department (HOSPITAL_COMMUNITY): Payer: Managed Care, Other (non HMO)

## 2021-04-14 ENCOUNTER — Encounter (HOSPITAL_COMMUNITY): Admission: EM | Disposition: A | Discharge status: Home / Self Care | Payer: Self-pay | Source: Home / Self Care

## 2021-04-14 DIAGNOSIS — R1011 Right upper quadrant pain: Secondary | ICD-10-CM | POA: Diagnosis present

## 2021-04-14 DIAGNOSIS — K8000 Calculus of gallbladder with acute cholecystitis without obstruction: Secondary | ICD-10-CM | POA: Diagnosis not present

## 2021-04-14 DIAGNOSIS — Z9049 Acquired absence of other specified parts of digestive tract: Secondary | ICD-10-CM

## 2021-04-14 DIAGNOSIS — Z20822 Contact with and (suspected) exposure to covid-19: Secondary | ICD-10-CM | POA: Diagnosis not present

## 2021-04-14 DIAGNOSIS — K810 Acute cholecystitis: Secondary | ICD-10-CM | POA: Diagnosis present

## 2021-04-14 DIAGNOSIS — R10811 Right upper quadrant abdominal tenderness: Secondary | ICD-10-CM

## 2021-04-14 DIAGNOSIS — K8012 Calculus of gallbladder with acute and chronic cholecystitis without obstruction: Principal | ICD-10-CM | POA: Insufficient documentation

## 2021-04-14 DIAGNOSIS — Z419 Encounter for procedure for purposes other than remedying health state, unspecified: Secondary | ICD-10-CM

## 2021-04-14 DIAGNOSIS — K769 Liver disease, unspecified: Secondary | ICD-10-CM

## 2021-04-14 HISTORY — PX: CHOLECYSTECTOMY: SHX55

## 2021-04-14 LAB — CBC WITH DIFFERENTIAL/PLATELET
Abs Immature Granulocytes: 0.03 10*3/uL (ref 0.00–0.07)
Basophils Absolute: 0 10*3/uL (ref 0.0–0.1)
Basophils Relative: 0 %
Eosinophils Absolute: 0 10*3/uL (ref 0.0–0.5)
Eosinophils Relative: 0 %
HCT: 40.1 % (ref 36.0–46.0)
Hemoglobin: 12.6 g/dL (ref 12.0–15.0)
Immature Granulocytes: 0 %
Lymphocytes Relative: 20 %
Lymphs Abs: 2.3 10*3/uL (ref 0.7–4.0)
MCH: 27.5 pg (ref 26.0–34.0)
MCHC: 31.4 g/dL (ref 30.0–36.0)
MCV: 87.6 fL (ref 80.0–100.0)
Monocytes Absolute: 0.6 10*3/uL (ref 0.1–1.0)
Monocytes Relative: 5 %
Neutro Abs: 8.6 10*3/uL — ABNORMAL HIGH (ref 1.7–7.7)
Neutrophils Relative %: 75 %
Platelets: 293 10*3/uL (ref 150–400)
RBC: 4.58 MIL/uL (ref 3.87–5.11)
RDW: 12.8 % (ref 11.5–15.5)
WBC: 11.6 10*3/uL — ABNORMAL HIGH (ref 4.0–10.5)
nRBC: 0 % (ref 0.0–0.2)

## 2021-04-14 LAB — COMPREHENSIVE METABOLIC PANEL
ALT: 21 U/L (ref 0–44)
AST: 17 U/L (ref 15–41)
Albumin: 4.7 g/dL (ref 3.5–5.0)
Alkaline Phosphatase: 55 U/L (ref 38–126)
Anion gap: 11 (ref 5–15)
BUN: 7 mg/dL (ref 6–20)
CO2: 25 mmol/L (ref 22–32)
Calcium: 9.4 mg/dL (ref 8.9–10.3)
Chloride: 103 mmol/L (ref 98–111)
Creatinine, Ser: 0.54 mg/dL (ref 0.44–1.00)
GFR, Estimated: >60 mL/min (ref >60)
Glucose, Bld: 91 mg/dL (ref 70–99)
Potassium: 3.7 mmol/L (ref 3.5–5.1)
Sodium: 139 mmol/L (ref 135–145)
Total Bilirubin: 1 mg/dL (ref 0.3–1.2)
Total Protein: 7.7 g/dL (ref 6.5–8.1)

## 2021-04-14 LAB — URINALYSIS, ROUTINE W REFLEX MICROSCOPIC
Bilirubin Urine: NEGATIVE
Glucose, UA: NEGATIVE mg/dL
Hgb urine dipstick: NEGATIVE
Ketones, ur: 15 mg/dL — AB
Leukocytes,Ua: NEGATIVE
Nitrite: NEGATIVE
Protein, ur: NEGATIVE mg/dL
Specific Gravity, Urine: <1.005 — ABNORMAL LOW (ref 1.005–1.030)
pH: 6.5 (ref 5.0–8.0)

## 2021-04-14 LAB — PREGNANCY, URINE: Preg Test, Ur: NEGATIVE

## 2021-04-14 LAB — RESP PANEL BY RT-PCR (FLU A&B, COVID) ARPGX2
Influenza A by PCR: NEGATIVE
Influenza B by PCR: NEGATIVE
SARS Coronavirus 2 by RT PCR: NEGATIVE

## 2021-04-14 LAB — LIPASE, BLOOD: Lipase: 45 U/L (ref 11–51)

## 2021-04-14 SURGERY — LAPAROSCOPIC CHOLECYSTECTOMY WITH INTRAOPERATIVE CHOLANGIOGRAM
Anesthesia: General | Site: Abdomen

## 2021-04-14 MED ORDER — DEXAMETHASONE SODIUM PHOSPHATE 10 MG/ML IJ SOLN
INTRAMUSCULAR | Status: DC | PRN
Start: 2021-04-14 — End: 2021-04-14
  Administered 2021-04-14: 10 mg via INTRAVENOUS

## 2021-04-14 MED ORDER — LACTATED RINGERS IV SOLN: INTRAVENOUS | Status: DC

## 2021-04-14 MED ORDER — SODIUM CHLORIDE 0.9 % IR SOLN
Status: DC | PRN
  Administered 2021-04-14: 1000 mL

## 2021-04-14 MED ORDER — CELECOXIB 200 MG PO CAPS
ORAL_CAPSULE | ORAL | Status: AC
  Administered 2021-04-14: 200 mg via ORAL
  Filled 2021-04-14: qty 1

## 2021-04-14 MED ORDER — KCL IN DEXTROSE-NACL 20-5-0.45 MEQ/L-%-% IV SOLN
INTRAVENOUS | Status: DC
  Filled 2021-04-14 (×3): qty 1000

## 2021-04-14 MED ORDER — ONDANSETRON HCL 4 MG/2ML IJ SOLN: 4.0000 mg | Freq: Four times a day (QID) | INTRAMUSCULAR | Status: DC | PRN

## 2021-04-14 MED ORDER — PROPOFOL 10 MG/ML IV BOLUS
INTRAVENOUS | Status: DC | PRN
  Administered 2021-04-14: 130 mg via INTRAVENOUS

## 2021-04-14 MED ORDER — GABAPENTIN 300 MG PO CAPS
ORAL_CAPSULE | ORAL | Status: AC
  Administered 2021-04-14: 300 mg via ORAL
  Filled 2021-04-14: qty 1

## 2021-04-14 MED ORDER — BUPIVACAINE-EPINEPHRINE (PF) 0.25% -1:200000 IJ SOLN
INTRAMUSCULAR | Status: AC
  Filled 2021-04-14: qty 30

## 2021-04-14 MED ORDER — SIMETHICONE 80 MG PO CHEW: 40.0000 mg | CHEWABLE_TABLET | Freq: Four times a day (QID) | ORAL | Status: DC | PRN

## 2021-04-14 MED ORDER — CHLORHEXIDINE GLUCONATE 0.12 % MT SOLN
OROMUCOSAL | Status: AC
  Administered 2021-04-14: 15 mL via OROMUCOSAL
  Filled 2021-04-14: qty 15

## 2021-04-14 MED ORDER — ROCURONIUM BROMIDE 10 MG/ML (PF) SYRINGE
PREFILLED_SYRINGE | INTRAVENOUS | Status: AC
  Filled 2021-04-14: qty 10

## 2021-04-14 MED ORDER — FENTANYL CITRATE (PF) 250 MCG/5ML IJ SOLN
INTRAMUSCULAR | Status: DC | PRN
  Administered 2021-04-14: 100 ug via INTRAVENOUS
  Administered 2021-04-14 (×3): 50 ug via INTRAVENOUS

## 2021-04-14 MED ORDER — ROCURONIUM BROMIDE 10 MG/ML (PF) SYRINGE
PREFILLED_SYRINGE | INTRAVENOUS | Status: DC | PRN
  Administered 2021-04-14: 40 mg via INTRAVENOUS
  Administered 2021-04-14: 10 mg via INTRAVENOUS

## 2021-04-14 MED ORDER — MIDAZOLAM HCL 2 MG/2ML IJ SOLN
INTRAMUSCULAR | Status: DC | PRN
  Administered 2021-04-14: 2 mg via INTRAVENOUS

## 2021-04-14 MED ORDER — GABAPENTIN 300 MG PO CAPS
300.0000 mg | ORAL_CAPSULE | Freq: Once | ORAL | Status: AC
Start: 2021-04-14 — End: 2021-04-14

## 2021-04-14 MED ORDER — FENTANYL CITRATE (PF) 100 MCG/2ML IJ SOLN
INTRAMUSCULAR | Status: AC
  Filled 2021-04-14: qty 2

## 2021-04-14 MED ORDER — ORAL CARE MOUTH RINSE: 15.0000 mL | Freq: Once | OROMUCOSAL | Status: AC

## 2021-04-14 MED ORDER — EPHEDRINE 5 MG/ML INJ
INTRAVENOUS | Status: AC
  Filled 2021-04-14: qty 5

## 2021-04-14 MED ORDER — AMISULPRIDE (ANTIEMETIC) 5 MG/2ML IV SOLN: 10.0000 mg | Freq: Once | INTRAVENOUS | Status: DC | PRN

## 2021-04-14 MED ORDER — FENTANYL CITRATE (PF) 100 MCG/2ML IJ SOLN
25.0000 ug | INTRAMUSCULAR | Status: DC | PRN
  Administered 2021-04-14: 50 ug via INTRAVENOUS

## 2021-04-14 MED ORDER — BUPIVACAINE-EPINEPHRINE 0.25% -1:200000 IJ SOLN
INTRAMUSCULAR | Status: DC | PRN
Start: 2021-04-14 — End: 2021-04-14
  Administered 2021-04-14: 30 mL

## 2021-04-14 MED ORDER — ENOXAPARIN SODIUM 40 MG/0.4ML IJ SOSY
40.0000 mg | PREFILLED_SYRINGE | INTRAMUSCULAR | Status: DC
  Filled 2021-04-14: qty 0.4

## 2021-04-14 MED ORDER — CIPROFLOXACIN IN D5W 400 MG/200ML IV SOLN
INTRAVENOUS | Status: AC
  Filled 2021-04-14: qty 200

## 2021-04-14 MED ORDER — PHENYLEPHRINE 40 MCG/ML (10ML) SYRINGE FOR IV PUSH (FOR BLOOD PRESSURE SUPPORT)
PREFILLED_SYRINGE | INTRAVENOUS | Status: DC | PRN
  Administered 2021-04-14 (×2): 80 ug via INTRAVENOUS
  Administered 2021-04-14: 40 ug via INTRAVENOUS

## 2021-04-14 MED ORDER — ACETAMINOPHEN 500 MG PO TABS
ORAL_TABLET | ORAL | Status: AC
  Administered 2021-04-14: 1000 mg via ORAL
  Filled 2021-04-14: qty 2

## 2021-04-14 MED ORDER — CIPROFLOXACIN IN D5W 400 MG/200ML IV SOLN
INTRAVENOUS | Status: DC | PRN
  Administered 2021-04-14: 400 mg via INTRAVENOUS

## 2021-04-14 MED ORDER — 0.9 % SODIUM CHLORIDE (POUR BTL) OPTIME
TOPICAL | Status: DC | PRN
Start: 2021-04-14 — End: 2021-04-14
  Administered 2021-04-14: 1000 mL

## 2021-04-14 MED ORDER — DIPHENHYDRAMINE HCL 50 MG/ML IJ SOLN: 12.5000 mg | Freq: Four times a day (QID) | INTRAMUSCULAR | Status: DC | PRN

## 2021-04-14 MED ORDER — EPHEDRINE SULFATE-NACL 50-0.9 MG/10ML-% IV SOSY
PREFILLED_SYRINGE | INTRAVENOUS | Status: DC | PRN
  Administered 2021-04-14: 5 mg via INTRAVENOUS

## 2021-04-14 MED ORDER — ONDANSETRON HCL 4 MG/2ML IJ SOLN
INTRAMUSCULAR | Status: AC
  Filled 2021-04-14: qty 2

## 2021-04-14 MED ORDER — DIPHENHYDRAMINE HCL 12.5 MG/5ML PO ELIX: 12.5000 mg | ORAL_SOLUTION | Freq: Four times a day (QID) | ORAL | Status: DC | PRN

## 2021-04-14 MED ORDER — MELATONIN 3 MG PO TABS: 3.0000 mg | ORAL_TABLET | Freq: Every evening | ORAL | Status: DC | PRN

## 2021-04-14 MED ORDER — ACETAMINOPHEN 500 MG PO TABS
1000.0000 mg | ORAL_TABLET | Freq: Three times a day (TID) | ORAL | Status: DC
  Administered 2021-04-14 – 2021-04-15 (×3): 1000 mg via ORAL
  Filled 2021-04-14 (×3): qty 2

## 2021-04-14 MED ORDER — MORPHINE SULFATE (PF) 2 MG/ML IV SOLN
1.0000 mg | INTRAVENOUS | Status: DC | PRN
  Administered 2021-04-14: 2 mg via INTRAVENOUS
  Filled 2021-04-14: qty 1

## 2021-04-14 MED ORDER — CHLORHEXIDINE GLUCONATE 0.12 % MT SOLN: 15.0000 mL | Freq: Once | OROMUCOSAL | Status: AC

## 2021-04-14 MED ORDER — ONDANSETRON 4 MG PO TBDP: 4.0000 mg | ORAL_TABLET | Freq: Four times a day (QID) | ORAL | Status: DC | PRN

## 2021-04-14 MED ORDER — OXYCODONE HCL 5 MG PO TABS
5.0000 mg | ORAL_TABLET | ORAL | Status: DC | PRN
  Administered 2021-04-14 – 2021-04-15 (×2): 10 mg via ORAL
  Filled 2021-04-14: qty 1
  Filled 2021-04-14 (×2): qty 2

## 2021-04-14 MED ORDER — OXYCODONE HCL 5 MG/5ML PO SOLN: 5.0000 mg | Freq: Once | ORAL | Status: DC | PRN

## 2021-04-14 MED ORDER — SUGAMMADEX SODIUM 200 MG/2ML IV SOLN
INTRAVENOUS | Status: DC | PRN
  Administered 2021-04-14: 140 mg via INTRAVENOUS

## 2021-04-14 MED ORDER — OXYCODONE HCL 5 MG PO TABS: 5.0000 mg | ORAL_TABLET | Freq: Once | ORAL | Status: DC | PRN

## 2021-04-14 MED ORDER — LIDOCAINE 2% (20 MG/ML) 5 ML SYRINGE
INTRAMUSCULAR | Status: AC
  Filled 2021-04-14: qty 5

## 2021-04-14 MED ORDER — MIDAZOLAM HCL 2 MG/2ML IJ SOLN
INTRAMUSCULAR | Status: AC
  Filled 2021-04-14: qty 2

## 2021-04-14 MED ORDER — LIDOCAINE 2% (20 MG/ML) 5 ML SYRINGE
INTRAMUSCULAR | Status: DC | PRN
Start: 2021-04-14 — End: 2021-04-14
  Administered 2021-04-14: 60 mg via INTRAVENOUS

## 2021-04-14 MED ORDER — DOCUSATE SODIUM 100 MG PO CAPS
100.0000 mg | ORAL_CAPSULE | Freq: Two times a day (BID) | ORAL | Status: DC
  Administered 2021-04-14 – 2021-04-15 (×2): 100 mg via ORAL
  Filled 2021-04-14 (×2): qty 1

## 2021-04-14 MED ORDER — ONDANSETRON HCL 4 MG/2ML IJ SOLN
INTRAMUSCULAR | Status: DC | PRN
  Administered 2021-04-14: 4 mg via INTRAVENOUS

## 2021-04-14 MED ORDER — FENTANYL CITRATE (PF) 250 MCG/5ML IJ SOLN
INTRAMUSCULAR | Status: AC
  Filled 2021-04-14: qty 5

## 2021-04-14 MED ORDER — PANTOPRAZOLE SODIUM 40 MG IV SOLR
40.0000 mg | Freq: Every day | INTRAVENOUS | Status: DC
  Administered 2021-04-14: 40 mg via INTRAVENOUS
  Filled 2021-04-14: qty 10

## 2021-04-14 MED ORDER — SODIUM CHLORIDE 0.9 % IV SOLN
INTRAVENOUS | Status: DC | PRN
  Administered 2021-04-14: 6 mL

## 2021-04-14 MED ORDER — IBUPROFEN 200 MG PO TABS: 600.0000 mg | ORAL_TABLET | Freq: Three times a day (TID) | ORAL | Status: DC | PRN

## 2021-04-14 MED ORDER — CELECOXIB 200 MG PO CAPS: 200.0000 mg | ORAL_CAPSULE | Freq: Once | ORAL | Status: AC

## 2021-04-14 MED ORDER — ACETAMINOPHEN 500 MG PO TABS: 1000.0000 mg | ORAL_TABLET | Freq: Once | ORAL | Status: AC

## 2021-04-14 MED ORDER — DEXAMETHASONE SODIUM PHOSPHATE 10 MG/ML IJ SOLN
INTRAMUSCULAR | Status: AC
  Filled 2021-04-14: qty 1

## 2021-04-14 SURGICAL SUPPLY — 48 items
APL PRP STRL LF DISP 70% ISPRP (MISCELLANEOUS) ×1
APPLIER CLIP 5 13 M/L LIGAMAX5 (MISCELLANEOUS) ×2
APR CLP MED LRG 5 ANG JAW (MISCELLANEOUS) ×1
BAG COUNTER SPONGE SURGICOUNT (BAG) ×2 IMPLANT
BAG SPEC RTRVL 10 TROC 200 (ENDOMECHANICALS) ×1
BAG SPNG CNTER NS LX DISP (BAG) ×1
BLADE CLIPPER SURG (BLADE) IMPLANT
CANISTER SUCT 3000ML PPV (MISCELLANEOUS) ×2 IMPLANT
CHLORAPREP W/TINT 26 (MISCELLANEOUS) ×2 IMPLANT
CLIP APPLIE 5 13 M/L LIGAMAX5 (MISCELLANEOUS) ×1 IMPLANT
COVER MAYO STAND STRL (DRAPES) ×2 IMPLANT
COVER SURGICAL LIGHT HANDLE (MISCELLANEOUS) ×2 IMPLANT
DRAPE C-ARM 42X120 X-RAY (DRAPES) ×2 IMPLANT
DRSG TEGADERM 2-3/8X2-3/4 SM (GAUZE/BANDAGES/DRESSINGS) ×4 IMPLANT
DRSG TEGADERM 4X4.75 (GAUZE/BANDAGES/DRESSINGS) ×1 IMPLANT
ELECT REM PT RETURN 9FT ADLT (ELECTROSURGICAL) ×2
ELECTRODE REM PT RTRN 9FT ADLT (ELECTROSURGICAL) ×1 IMPLANT
GAUZE SPONGE 2X2 8PLY STRL LF (GAUZE/BANDAGES/DRESSINGS) ×1 IMPLANT
GLOVE SURG ENC TEXT LTX SZ7.5 (GLOVE) ×2 IMPLANT
GLOVE SURG UNDER LTX SZ8 (GLOVE) ×4 IMPLANT
GOWN STRL REUS W/ TWL LRG LVL3 (GOWN DISPOSABLE) ×3 IMPLANT
GOWN STRL REUS W/TWL 2XL LVL3 (GOWN DISPOSABLE) ×2 IMPLANT
GOWN STRL REUS W/TWL LRG LVL3 (GOWN DISPOSABLE) ×6
GRASPER SUT TROCAR 14GX15 (MISCELLANEOUS) ×2 IMPLANT
HEMOSTAT SNOW SURGICEL 2X4 (HEMOSTASIS) ×1 IMPLANT
KIT BASIN OR (CUSTOM PROCEDURE TRAY) ×2 IMPLANT
KIT TURNOVER KIT B (KITS) ×2 IMPLANT
NS IRRIG 1000ML POUR BTL (IV SOLUTION) ×2 IMPLANT
PAD ARMBOARD 7.5X6 YLW CONV (MISCELLANEOUS) ×2 IMPLANT
POUCH RETRIEVAL ECOSAC 10 (ENDOMECHANICALS) ×1 IMPLANT
POUCH RETRIEVAL ECOSAC 10MM (ENDOMECHANICALS) ×2
SCISSORS LAP 5X35 DISP (ENDOMECHANICALS) ×2 IMPLANT
SET CHOLANGIOGRAPH 5 50 .035 (SET/KITS/TRAYS/PACK) ×2 IMPLANT
SET IRRIG TUBING LAPAROSCOPIC (IRRIGATION / IRRIGATOR) ×2 IMPLANT
SET TUBE SMOKE EVAC HIGH FLOW (TUBING) ×2 IMPLANT
SLEEVE ENDOPATH XCEL 5M (ENDOMECHANICALS) ×4 IMPLANT
SPECIMEN JAR SMALL (MISCELLANEOUS) ×2 IMPLANT
SPONGE GAUZE 2X2 STER 10/PKG (GAUZE/BANDAGES/DRESSINGS) ×1
STRIP CLOSURE SKIN 1/2X4 (GAUZE/BANDAGES/DRESSINGS) ×2 IMPLANT
SUT MNCRL AB 4-0 PS2 18 (SUTURE) ×2 IMPLANT
SUT VIC AB 0 UR5 27 (SUTURE) ×2 IMPLANT
SUT VICRYL 0 UR6 27IN ABS (SUTURE) IMPLANT
TOWEL GREEN STERILE (TOWEL DISPOSABLE) ×2 IMPLANT
TOWEL GREEN STERILE FF (TOWEL DISPOSABLE) ×2 IMPLANT
TRAY LAPAROSCOPIC MC (CUSTOM PROCEDURE TRAY) ×2 IMPLANT
TROCAR XCEL BLUNT TIP 100MML (ENDOMECHANICALS) ×2 IMPLANT
TROCAR XCEL NON-BLD 5MMX100MML (ENDOMECHANICALS) ×2 IMPLANT
WATER STERILE IRR 1000ML POUR (IV SOLUTION) ×2 IMPLANT

## 2021-04-14 NOTE — Discharge Instructions (Signed)
[facility] °LAPAROSCOPIC SURGERY: POST OP INSTRUCTIONS °Always review your discharge instruction sheet given to you by the facility where your surgery was performed. °IF YOU HAVE DISABILITY OR FAMILY LEAVE FORMS, YOU MUST BRING THEM TO THE OFFICE FOR PROCESSING.   °DO NOT GIVE THEM TO YOUR DOCTOR. ° °PAIN CONTROL ° °First take acetaminophen (Tylenol) AND/or ibuprofen (Advil) to control your pain after surgery.  Follow directions on package.  Taking acetaminophen (Tylenol) and/or ibuprofen (Advil) regularly after surgery will help to control your pain and lower the amount of prescription pain medication you may need.  You should not take more than 3,000 mg (3 grams) of acetaminophen (Tylenol) in 24 hours.  You should not take ibuprofen (Advil), aleve, motrin, naprosyn or other NSAIDS if you have a history of stomach ulcers or chronic kidney disease.  °A prescription for pain medication may be given to you upon discharge.  Take your pain medication as prescribed, if you still have uncontrolled pain after taking acetaminophen (Tylenol) or ibuprofen (Advil). °Use ice packs to help control pain. °If you need a refill on your pain medication, please contact your pharmacy.  They will contact our office to request authorization. Prescriptions will not be filled after 5pm or on week-ends. ° °HOME MEDICATIONS °Take your usually prescribed medications unless otherwise directed. ° °DIET °You should follow a light diet the first few days after arrival home.  Be sure to include lots of fluids daily. Avoid fatty, fried foods.  ° °CONSTIPATION °It is common to experience some constipation after surgery and if you are taking pain medication.  Increasing fluid intake and taking a stool softener (such as Colace) will usually help or prevent this problem from occurring.  A mild laxative (Milk of Magnesia or Miralax) should be taken according to package instructions if there are no bowel movements after 48  hours. ° °WOUND/INCISION CARE °Most patients will experience some swelling and bruising in the area of the incisions.  Ice packs will help.  Swelling and bruising can take several days to resolve.  °Unless discharge instructions indicate otherwise, follow guidelines below  °STERI-STRIPS - you may remove your outer bandages 48 hours after surgery, and you may shower at that time.  You have steri-strips (small skin tapes) in place directly over the incision.  These strips should be left on the skin for 7-10 days.   °DERMABOND/SKIN GLUE - you may shower in 24 hours.  The glue will flake off over the next 2-3 weeks. °Any sutures or staples will be removed at the office during your follow-up visit. ° °ACTIVITIES °You may resume regular (light) daily activities beginning the next day--such as daily self-care, walking, climbing stairs--gradually increasing activities as tolerated.  You may have sexual intercourse when it is comfortable.  Refrain from any heavy lifting or straining until approved by your doctor. °You may drive when you are no longer taking prescription pain medication, you can comfortably wear a seatbelt, and you can safely maneuver your car and apply brakes. ° °FOLLOW-UP °You should see your doctor in the office for a follow-up appointment approximately 2-3 weeks after your surgery.  You should have been given your post-op/follow-up appointment when your surgery was scheduled.  If you did not receive a post-op/follow-up appointment, make sure that you call for this appointment within a day or two after you arrive home to insure a convenient appointment time. ° ° °WHEN TO CALL YOUR DOCTOR: °Fever over 101.0 °Inability to urinate °Continued bleeding from incision. °  Increased pain, redness, or drainage from the incision. °Increasing abdominal pain ° °The clinic staff is available to answer your questions during regular business hours.  Please don’t hesitate to call and ask to speak to one of the nurses for  clinical concerns.  If you have a medical emergency, go to the nearest emergency room or call 911.  A surgeon from [facility] is always on call at the hospital. °[address], [address], [place], [place]  [address] ? [address] [address], , [place]   [address] °[phone] ? [phone] ? FAX [phone] °Web site: [email or web address]  ° ° ° ° °Managing Your Pain After Surgery Without Opioids ° ° ° °Thank you for participating in our program to help patients manage their pain after surgery without opioids. This is part of our effort to provide you with the best care possible, without exposing you or your family to the risk that opioids pose. ° °What pain can I expect after surgery? °You can expect to have some pain after surgery. This is normal. The pain is typically worse the day after surgery, and quickly begins to get better. °Many studies have found that many patients are able to manage their pain after surgery with Over-the-Counter (OTC) medications such as Tylenol and Motrin. If you have a condition that does not allow you to take Tylenol or Motrin, notify your surgical team. ° °How will I manage my pain? °The best strategy for controlling your pain after surgery is around the clock pain control with Tylenol (acetaminophen) and Motrin (ibuprofen or Advil). Alternating these medications with each other allows you to maximize your pain control. In addition to Tylenol and Motrin, you can use heating pads or ice packs on your incisions to help reduce your pain. ° °How will I alternate your regular strength over-the-counter pain medication? °You will take a dose of pain medication every three hours. °Start by taking 650 mg of Tylenol (2 pills of 325 mg) °3 hours later take 600 mg of Motrin (3 pills of 200 mg) °3 hours after taking the Motrin take 650 mg of Tylenol °3 hours after that take 600 mg of Motrin. ° ° °- 1 - ° °See example - if your first dose of Tylenol is at 12:00  PM ° ° °12:00 PM Tylenol 650 mg (2 pills of 325 mg)  °3:00 PM Motrin 600 mg (3 pills of 200 mg)  °6:00 PM Tylenol 650 mg (2 pills of 325 mg)  °9:00 PM Motrin 600 mg (3 pills of 200 mg)  °Continue alternating every 3 hours  ° °We recommend that you follow this schedule around-the-clock for at least 3 days after surgery, or until you feel that it is no longer needed. Use the table on the last page of this handout to keep track of the medications you are taking. °Important: °Do not take more than 3000mg of Tylenol or 3200mg of Motrin in a 24-hour period. °Do not take ibuprofen/Motrin if you have a history of bleeding stomach ulcers, severe kidney disease, &/or actively taking a blood thinner ° °What if I still have pain? °If you have pain that is not controlled with the over-the-counter pain medications (Tylenol and Motrin or Advil) you might have what we call “breakthrough” pain. You will receive a prescription for a small amount of an opioid pain medication such as Oxycodone, Tramadol, or Tylenol with Codeine. Use these opioid pills in the first 24 hours after surgery if you have breakthrough pain. Do   not take more than 1 pill every 4-6 hours. ° °If you still have uncontrolled pain after using all opioid pills, don't hesitate to call our staff using the number provided. We will help make sure you are managing your pain in the best way possible, and if necessary, we can provide a prescription for additional pain medication. ° ° °Day 1   ° °Time  °Name of Medication Number of pills taken  °Amount of Acetaminophen  °Pain Level  ° °Comments  °AM PM       °AM PM       °AM PM       °AM PM       °AM PM       °AM PM       °AM PM       °AM PM       °Total Daily amount of Acetaminophen °Do not take more than  3,000 mg per day    ° ° °Day 2   ° °Time  °Name of Medication Number of pills °taken  °Amount of Acetaminophen  °Pain Level  ° °Comments  °AM PM       °AM PM       °AM PM       °AM PM       °AM PM       °AM PM       °AM  PM       °AM PM       °Total Daily amount of Acetaminophen °Do not take more than  3,000 mg per day    ° ° °Day 3   ° °Time  °Name of Medication Number of pills taken  °Amount of Acetaminophen  °Pain Level  ° °Comments  °AM PM       °AM PM       °AM PM       °AM PM       ° ° ° °AM PM       °AM PM       °AM PM       °AM PM       °Total Daily amount of Acetaminophen °Do not take more than  3,000 mg per day    ° ° °Day 4   ° °Time  °Name of Medication Number of pills taken  °Amount of Acetaminophen  °Pain Level  ° °Comments  °AM PM       °AM PM       °AM PM       °AM PM       °AM PM       °AM PM       °AM PM       °AM PM       °Total Daily amount of Acetaminophen °Do not take more than  3,000 mg per day    ° ° °Day 5   ° °Time  °Name of Medication Number °of pills taken  °Amount of Acetaminophen  °Pain Level  ° °Comments  °AM PM       °AM PM       °AM PM       °AM PM       °AM PM       °AM PM       °AM PM       °AM PM       °Total Daily amount of Acetaminophen °Do not take more than    3,000 mg per day    ° ° ° °Day 6   ° °Time  °Name of Medication Number of pills °taken  °Amount of Acetaminophen  °Pain Level  °Comments  °AM PM       °AM PM       °AM PM       °AM PM       °AM PM       °AM PM       °AM PM       °AM PM       °Total Daily amount of Acetaminophen °Do not take more than  3,000 mg per day    ° ° °Day 7   ° °Time  °Name of Medication Number of pills taken  °Amount of Acetaminophen  °Pain Level  ° °Comments  °AM PM       °AM PM       °AM PM       °AM PM       °AM PM       °AM PM       °AM PM       °AM PM       °Total Daily amount of Acetaminophen °Do not take more than  3,000 mg per day    ° ° ° ° °For additional information about how and where to safely dispose of unused opioid °medications - https://www.morepowerfulnc.org ° °Disclaimer: This document contains information and/or instructional materials adapted from [facility] for the typical patient with your condition. It does not replace medical advice  from your health care provider because your experience may differ from that of the °typical patient. Talk to your health care provider if you have any questions about this °document, your condition or your treatment plan. °Adapted from [facility]  °

## 2021-04-14 NOTE — ED Triage Notes (Signed)
Pt endorses mid upper abd pain that radiates to left side since last night. Woke her up at 130 with pain and tried eating granola. Last BM at 715 today. Denies n/v/d.  ?

## 2021-04-14 NOTE — Plan of Care (Signed)

## 2021-04-14 NOTE — ED Provider Notes (Signed)
[facility] EMERGENCY DEPT Provider Note   CSN: [id number] Arrival date & time: 04/14/21  0754     History  Chief Complaint  Patient presents with   Abdominal Pain    Amanda Caldwell is a 31 y.o. female.  HPI     31yo female with no significant medical history presents with concern for abdominal pain.  Reports eating last night and about 2 hours later developed epigastric abdominal pain.  She went to bed and woke up at 130AM with more severe pain, tried to have BM, ate granola and drank water. Water felt soothing, but pain otherwise persisted.  Had BM this morning. No nausea, vomiting, diarrhea, urinary symptoms, fever, cough, shortness of breath, chest pain. NO hx of etoh use, NSAID use, gallbaldder problems. No hx of abdominal surgeries.   Past Medical History:  Diagnosis Date   Medical history non-contributory     Past Surgical History:  Procedure Laterality Date   ELBOW CLOSED REDUCTION W/ PERCUANEOUS PINNING  1997   NO PAST SURGERIES       Home Medications Prior to Admission medications   Medication Sig Start Date End Date Taking? Authorizing Provider  ibuprofen (ADVIL) 600 MG tablet Take 1 tablet (600 mg total) by mouth every 6 (six) hours. Patient not taking: Reported on 04/14/2021 05/25/20   Cresenzo-Dishmon, Scarlette Calico, CNM      Allergies    Amoxicillin    Review of Systems   Review of Systems See above  Physical Exam Updated Vital Signs BP 102/68 (BP Location: Right Arm)    Pulse 84    Temp 97.6 F (36.4 C) (Oral)    Resp 15    Ht 5\' 5"  (1.651 m)    Wt 59 kg    SpO2 98%    Breastfeeding Yes    BMI 21.63 kg/m  Physical Exam Vitals and nursing note reviewed.  Constitutional:      General: She is not in acute distress.    Appearance: She is well-developed. She is not diaphoretic.  HENT:     Head: Normocephalic and atraumatic.  Eyes:     Conjunctiva/sclera: Conjunctivae normal.  Cardiovascular:     Rate and Rhythm: Normal rate and regular  rhythm.  Pulmonary:     Effort: Pulmonary effort is normal. No respiratory distress.  Abdominal:     General: There is no distension.     Palpations: Abdomen is soft.     Tenderness: There is abdominal tenderness in the right upper quadrant and epigastric area. There is no right CVA tenderness, left CVA tenderness or guarding. Positive signs include Murphy's sign.  Musculoskeletal:        General: No tenderness.     Cervical back: Normal range of motion.  Skin:    General: Skin is warm and dry.     Findings: No erythema or rash.  Neurological:     Mental Status: She is alert and oriented to person, place, and time.    ED Results / Procedures / Treatments   Labs (all labs ordered are listed, but only abnormal results are displayed) Labs Reviewed  CBC WITH DIFFERENTIAL/PLATELET - Abnormal; Notable for the following components:      Result Value   WBC 11.6 (*)    Neutro Abs 8.6 (*)    All other components within normal limits  URINALYSIS, ROUTINE W REFLEX MICROSCOPIC - Abnormal; Notable for the following components:   Color, Urine COLORLESS (*)    Specific Gravity, Urine <1.005 (*)  Ketones, ur 15 (*)    All other components within normal limits  RESP PANEL BY RT-PCR (FLU A&B, COVID) ARPGX2  PREGNANCY, URINE  COMPREHENSIVE METABOLIC PANEL  LIPASE, BLOOD  CBC  SURGICAL PATHOLOGY    EKG None  Radiology DG Cholangiogram Operative  Result Date: 04/14/2021 CLINICAL DATA:  Cholecystitis EXAM: INTRAOPERATIVE CHOLANGIOGRAM COMPARISON:  US Abdomen, 04/14/2021 FLUOROSCOPY: Exposure Index (as provided by the fluoroscopic device): 0.8 mGy Kerma FINDINGS: Limited oblique planar images of the RIGHT upper quadrant obtained C-arm. Images demonstrating laparoscopic instrumentation, cystic duct cannulation and antegrade cholangiogram. Free spillage of contrast into the duodenum. No biliary ductal dilation. No evidence of biliary filling defect is demonstrated. IMPRESSION: Fluoroscopic  imaging for IOC. No biliary ductal dilation or filling defect is demonstrated For complete description of intra procedural findings, please see performing service dictation. Electronically Signed   By: Roanna Banning M.D.   On: 04/14/2021 16:06   US Abdomen Limited RUQ (LIVER/GB)  Result Date: 04/14/2021 CLINICAL DATA:  Epigastric pain. EXAM: ULTRASOUND ABDOMEN LIMITED RIGHT UPPER QUADRANT COMPARISON:  None. FINDINGS: Gallbladder: Cholelithiasis with the largest measuring 15 mm. Non mobile calculus impacted in the gallbladder neck. Top-normal gallbladder wall thickness measuring 2.5 mm. Gallbladder is distended. Positive sonographic Murphy sign. No pericholecystic fluid. Common bile duct: Diameter: 2 mm Liver: Normal hepatic echogenicity. Three hyperechoic right hepatic masses measuring 1.1 x 1.1 x 1.2 cm, 0.5 x 0.8 x 0.5 cm, 0.5 x 0.6 x 0.7 cm respectively likely reflecting small hemangiomas. The 1.1 x 1.1 x 1.2 cm hepatic mass as central hypoechogenicity with peripheral hyperechogenicity likely reflecting an atypical hemangioma. Portal vein is patent on color Doppler imaging with normal direction of blood flow towards the liver. Other: None. IMPRESSION: 1. Cholelithiasis with a nonmobile calculus impacted in the gallbladder neck with gallbladder distention. Positive sonographic Murphy sign. Findings are concerning for, but not diagnostic of, acute cholecystitis. 2. 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 Recommend non emergent MRI of the abdomen without and with intravenous contrast for definitive characterization. Electronically Signed   By: Elige Ko M.D.   On: 04/14/2021 09:21    Procedures Procedures    Medications Ordered in ED Medications  enoxaparin (LOVENOX) injection 40 mg (has no administration in time range)  dextrose 5 % and 0.45 % NaCl with KCl 20 mEq/L infusion ( Intravenous Infusion Verify 04/14/21 1715)  acetaminophen (TYLENOL) tablet 1,000 mg (1,000 mg  Oral Given 04/14/21 2115)  ibuprofen (ADVIL) tablet 600 mg (has no administration in time range)  oxyCODONE (Oxy IR/ROXICODONE) immediate release tablet 5-10 mg (10 mg Oral Given 04/14/21 2115)  morphine (PF) 2 MG/ML injection 1-2 mg (2 mg Intravenous Given 04/14/21 1812)  diphenhydrAMINE (BENADRYL) 12.5 MG/5ML elixir 12.5 mg (has no administration in time range)    Or  diphenhydrAMINE (BENADRYL) injection 12.5 mg (has no administration in time range)  melatonin tablet 3 mg (has no administration in time range)  docusate sodium (COLACE) capsule 100 mg (100 mg Oral Given 04/14/21 2115)  ondansetron (ZOFRAN-ODT) disintegrating tablet 4 mg (has no administration in time range)    Or  ondansetron (ZOFRAN) injection 4 mg (has no administration in time range)  simethicone (MYLICON) chewable tablet 40 mg (has no administration in time range)  pantoprazole (PROTONIX) injection 40 mg (40 mg Intravenous Given 04/14/21 2118)  fentaNYL (SUBLIMAZE) 100 MCG/2ML injection (  See Procedure Record 04/14/21 1638)  chlorhexidine (PERIDEX) 0.12 % solution 15 mL (15 mLs Mouth/Throat Given 04/14/21 1214)    Or  MEDLINE mouth rinse ( Mouth Rinse See Alternative 04/14/21 1214)  acetaminophen (TYLENOL) tablet 1,000 mg (1,000 mg Oral Given 04/14/21 1213)  celecoxib (CELEBREX) capsule 200 mg (200 mg Oral Given 04/14/21 1213)  gabapentin (NEURONTIN) capsule 300 mg (300 mg Oral Given 04/14/21 1213)  ciprofloxacin (CIPRO) 400 MG/200ML IVPB (  Override pull for Anesthesia 04/14/21 1244)    ED Course/ Medical Decision Making/ A&P                           Medical Decision Making Amount and/or Complexity of Data Reviewed Labs: ordered. Radiology: ordered.    31yo female with no significant medical history presents with concern for abdominal pain.    DDx includes appendicitis, pancreatitis, cholecystitis, pyelonephritis, nephrolithiasis, diverticulitis, PID, ovarian torsion, ectopic pregnancy, and tuboovarian abscess   Labs personally  reviewed and interpreted by me Pregnancy test negative. UA without infection. No transaminitis or pancreatitis. Mild leukocytosis.   RUQ US shows cholelithiasis with nonmobile calculus impacted in the gallbaldder neck with gallbladder distention and hepatic masses likely hemangiomas. Discussed need for outpatient MRI through PCP of abdomen to evaluate hemangiomas.    Discussed with general surgery, Dr. Andrey Campanile and patient.  Findings consistent with symptomatic cholelithasis/early cholecystitis. Declining pain medication but reports continuing pain. Will transfer to preop for further care and cholecystectomy.         Final Clinical Impression(s) / ED Diagnoses Final diagnoses:  RUQ abdominal tenderness  Liver lesion  Calculus of gallbladder with acute cholecystitis without obstruction    Rx / DC Orders ED Discharge Orders     None         Alvira Monday, MD 04/14/21 2357

## 2021-04-14 NOTE — Anesthesia Postprocedure Evaluation (Signed)
Anesthesia Post Note ? ?Patient: Edythe Lynn Bieser ? ?Procedure(s) Performed: LAPAROSCOPIC CHOLECYSTECTOMY WITH INTRAOPERATIVE CHOLANGIOGRAM (Abdomen) ? ?  ? ?Patient location during evaluation: PACU ?Anesthesia Type: General ?Level of consciousness: awake and alert ?Pain management: pain level controlled ?Vital Signs Assessment: post-procedure vital signs reviewed and stable ?Respiratory status: spontaneous breathing, nonlabored ventilation, respiratory function stable and patient connected to nasal cannula oxygen ?Cardiovascular status: blood pressure returned to baseline and stable ?Postop Assessment: no apparent nausea or vomiting ?Anesthetic complications: no ? ? ?No notable events documented. ? ?Last Vitals:  ?Vitals:  ? 04/14/21 1521 04/14/21 1544  ?BP: 111/75 107/74  ?Pulse: 87 93  ?Resp: 12 13  ?Temp: 36.9 ?C 36.7 ?C  ?SpO2: 98% 98%  ?  ?Last Pain:  ?Vitals:  ? 04/14/21 1815  ?TempSrc:   ?PainSc: 10-Worst pain ever  ? ? ?  ?  ?  ?  ?  ?  ? ?Marcene Duos E ? ? ? ? ?

## 2021-04-14 NOTE — Anesthesia Preprocedure Evaluation (Signed)
Anesthesia Evaluation  ?Patient identified by MRN, date of birth, ID band ?Patient awake ? ? ? ?Reviewed: ?Allergy & Precautions, NPO status , Patient's Chart, lab work & pertinent test results ? ?Airway ?Mallampati: II ? ?TM Distance: >3 FB ?Neck ROM: Full ? ? ? Dental ? ?(+) Dental Advisory Given ?  ?Pulmonary ?neg pulmonary ROS,  ?  ?breath sounds clear to auscultation ? ? ? ? ? ? Cardiovascular ?negative cardio ROS ? ? ?Rhythm:Regular Rate:Normal ? ? ?  ?Neuro/Psych ?negative neurological ROS ?   ? GI/Hepatic ?negative GI ROS, Neg liver ROS,   ?Endo/Other  ?negative endocrine ROS ? Renal/GU ?negative Renal ROS  ? ?  ?Musculoskeletal ? ? Abdominal ?  ?Peds ? Hematology ?negative hematology ROS ?(+)   ?Anesthesia Other Findings ? ? Reproductive/Obstetrics ? ?  ? ? ? ? ? ? ? ? ? ? ? ? ? ?  ?  ? ? ? ? ? ? ? ? ?Anesthesia Physical ?Anesthesia Plan ? ?ASA: 1 ? ?Anesthesia Plan: General  ? ?Post-op Pain Management: Tylenol PO (pre-op)*, Celebrex PO (pre-op)* and Gabapentin PO (pre-op)*  ? ?Induction: Intravenous ? ?PONV Risk Score and Plan: 3 and Dexamethasone, Ondansetron, Midazolam and Treatment may vary due to age or medical condition ? ?Airway Management Planned: Oral ETT ? ?Additional Equipment: None ? ?Intra-op Plan:  ? ?Post-operative Plan: Extubation in OR ? ?Informed Consent: I have reviewed the patients History and Physical, chart, labs and discussed the procedure including the risks, benefits and alternatives for the proposed anesthesia with the patient or authorized representative who has indicated his/her understanding and acceptance.  ? ? ? ?Dental advisory given ? ?Plan Discussed with: CRNA ? ?Anesthesia Plan Comments:   ? ? ? ? ? ? ?Anesthesia Quick Evaluation ? ?

## 2021-04-14 NOTE — Transfer of Care (Signed)
Immediate Anesthesia Transfer of Care Note ? ?Patient: Amanda Caldwell ? ?Procedure(s) Performed: LAPAROSCOPIC CHOLECYSTECTOMY WITH INTRAOPERATIVE CHOLANGIOGRAM (Abdomen) ? ?Patient Location: PACU ? ?Anesthesia Type:General ? ?Level of Consciousness: awake and drowsy ? ?Airway & Oxygen Therapy: Patient Spontanous Breathing ? ?Post-op Assessment: Report given to RN, Post -op Vital signs reviewed and stable and Patient moving all extremities X 4 ? ?Post vital signs: Reviewed and stable ? ?Last Vitals:  ?Vitals Value Taken Time  ?BP 114/71 04/14/21 1421  ?Temp    ?Pulse 118 04/14/21 1421  ?Resp 20 04/14/21 1421  ?SpO2 99 % 04/14/21 1421  ?Vitals shown include unvalidated device data. ? ?Last Pain:  ?Vitals:  ? 04/14/21 1207  ?TempSrc: Oral  ?PainSc:   ?   ? ?  ? ?Complications: No notable events documented. ?

## 2021-04-14 NOTE — Anesthesia Procedure Notes (Signed)
Procedure Name: Intubation ?Date/Time: 04/14/2021 12:35 PM ?Performed by: Nils Pyle, CRNA ?Pre-anesthesia Checklist: Patient identified, Emergency Drugs available, Suction available and Patient being monitored ?Patient Re-evaluated:Patient Re-evaluated prior to induction ?Oxygen Delivery Method: Circle System Utilized ?Preoxygenation: Pre-oxygenation with 100% oxygen ?Induction Type: IV induction ?Ventilation: Mask ventilation without difficulty ?Laryngoscope Size: Hyacinth Meeker and 2 ?Grade View: Grade I ?Tube type: Oral ?Tube size: 7.0 mm ?Number of attempts: 1 ?Airway Equipment and Method: Stylet and Oral airway ?Placement Confirmation: ETT inserted through vocal cords under direct vision, positive ETCO2 and breath sounds checked- equal and bilateral ?Secured at: 22 cm ?Tube secured with: Tape ?Dental Injury: Teeth and Oropharynx as per pre-operative assessment  ? ? ? ? ?

## 2021-04-14 NOTE — H&P (Addendum)
H&P Note  Amanda Caldwell [birth year]  [id number].    Requesting MD: Alvira Monday, MD Chief Complaint/Reason for Consult: Acute cholecystitis   HPI:  Patient is an otherwise healthy 31 year old female who presented to [facility] today with RUQ abdominal pain. Patient reports pain started last night and woke her up around 0130 this AM. She reports pain radiates to the left side. She tried eating some granola and this did not help. Last BM today around 0715. Not having nausea or emesis. Subjective f/c but denies chest pain, SOB, urinary symptoms. No prior abdominal surgery. She is not on any blood thinning medications. Thought it was initially bad food but since pain did not get better and was actually worsening she [facility] [place]  ROS:  Review of Systems  Constitutional:  Negative for chills and fever.  Respiratory:  Negative for shortness of breath and wheezing.   Cardiovascular:  Negative for chest pain and palpitations.  Gastrointestinal:  Positive for abdominal pain. Negative for constipation, diarrhea, nausea and vomiting.  Genitourinary:  Negative for dysuria.  All other systems reviewed and are negative.  No family history on file.  Past Medical History:  Diagnosis Date   Medical history non-contributory     Past Surgical History:  Procedure Laterality Date   NO PAST SURGERIES      Social History:  reports that she has never smoked. She has never used smokeless tobacco. She reports that she does not drink alcohol and does not use drugs.  Allergies:  Allergies  Allergen Reactions   Amoxicillin Swelling    (Not in a hospital admission)   Blood pressure 97/66, pulse 76, temperature 97.7 F (36.5 C), temperature source Oral, resp. rate 16, height 5\' 5"  (1.651 m), weight 59 kg, SpO2 100 %, currently breastfeeding. Physical Exam:  General: pleasant, WD,  female who is laying in bed in NAD HEENT: head is normocephalic, atraumatic.  Sclera are  anicteric.  Ears and nose without any masses or lesions.  Mouth is pink and moist Heart: regular, rate, and rhythm.  Normal s1,s2. No obvious murmurs, gallops, or rubs noted.  Palpable radial and pedal pulses bilaterally Lungs: CTAB, no wheezes, rhonchi, or rales noted.  Respiratory effort nonlabored Abd: soft, min TTP, ND, +BS, no masses, hernias, or organomegaly MS: all 4 extremities are symmetrical with no cyanosis, clubbing, or edema. Skin: warm and dry with no masses, lesions, or rashes Neuro: Cranial nerves 2-12 grossly intact, sensation is normal throughout Psych: A&Ox3 with an appropriate affect.   Results for orders placed or performed during the hospital encounter of 04/14/21 (from the past 48 hour(s))  Pregnancy, urine     Status: None   Collection Time: 04/14/21  8:58 AM  Result Value Ref Range   Preg Test, Ur NEGATIVE NEGATIVE    Comment:        THE SENSITIVITY OF THIS METHODOLOGY IS >20 mIU/mL. Performed at 06/14/21, [address], [place], [place]   CBC with Differential     Status: Abnormal   Collection Time: 04/14/21  8:58 AM  Result Value Ref Range   WBC 11.6 (H) 4.0 - 10.5 K/uL   RBC 4.58 3.87 - 5.11 MIL/uL   Hemoglobin 12.6 12.0 - 15.0 g/dL   HCT 06/14/21 32.6 - 71.2 %   MCV 87.6 80.0 - 100.0 fL   MCH 27.5 26.0 - 34.0 pg   MCHC 31.4 30.0 - 36.0 g/dL  RDW 12.8 11.5 - 15.5 %   Platelets 293 150 - 400 K/uL   nRBC 0.0 0.0 - 0.2 %   Neutrophils Relative % 75 %   Neutro Abs 8.6 (H) 1.7 - 7.7 K/uL   Lymphocytes Relative 20 %   Lymphs Abs 2.3 0.7 - 4.0 K/uL   Monocytes Relative 5 %   Monocytes Absolute 0.6 0.1 - 1.0 K/uL   Eosinophils Relative 0 %   Eosinophils Absolute 0.0 0.0 - 0.5 K/uL   Basophils Relative 0 %   Basophils Absolute 0.0 0.0 - 0.1 K/uL   Immature Granulocytes 0 %   Abs Immature Granulocytes 0.03 0.00 - 0.07 K/uL    Comment: Performed at [facility], [address] [place], [place] [address]   Comprehensive metabolic panel     Status: None   Collection Time: 04/14/21  8:58 AM  Result Value Ref Range   Sodium 139 135 - 145 mmol/L   Potassium 3.7 3.5 - 5.1 mmol/L   Chloride 103 98 - 111 mmol/L   CO2 25 22 - 32 mmol/L   Glucose, Bld 91 70 - 99 mg/dL    Comment: Glucose reference range applies only to samples taken after fasting for at least 8 hours.   BUN 7 6 - 20 mg/dL   Creatinine, Ser 9.76 0.44 - 1.00 mg/dL   Calcium 9.4 8.9 - 73.4 mg/dL   Total Protein 7.7 6.5 - 8.1 g/dL   Albumin 4.7 3.5 - 5.0 g/dL   AST 17 15 - 41 U/L   ALT 21 0 - 44 U/L   Alkaline Phosphatase 55 38 - 126 U/L   Total Bilirubin 1.0 0.3 - 1.2 mg/dL   GFR, Estimated >19 >37 mL/min    Comment: (NOTE) Calculated using the CKD-EPI Creatinine Equation (2021)    Anion gap 11 5 - 15    Comment: Performed at [facility], [address], [place], [place] [address]  Lipase, blood     Status: None   Collection Time: 04/14/21  8:58 AM  Result Value Ref Range   Lipase 45 11 - 51 U/L    Comment: Performed at [facility], [address], [place], [place] [address]  Urinalysis, Routine w reflex microscopic Urine, Clean Catch     Status: Abnormal   Collection Time: 04/14/21  8:58 AM  Result Value Ref Range   Color, Urine COLORLESS (A) YELLOW   APPearance CLEAR CLEAR   Specific Gravity, Urine <1.005 (L) 1.005 - 1.030   pH 6.5 5.0 - 8.0   Glucose, UA NEGATIVE NEGATIVE mg/dL   Hgb urine dipstick NEGATIVE NEGATIVE   Bilirubin Urine NEGATIVE NEGATIVE   Ketones, ur 15 (A) NEGATIVE mg/dL   Protein, ur NEGATIVE NEGATIVE mg/dL   Nitrite NEGATIVE NEGATIVE   Leukocytes,Ua NEGATIVE NEGATIVE    Comment: Performed at [facility], [address], [place], [place] [address]   US Abdomen Limited RUQ (LIVER/GB)  Result Date: 04/14/2021 CLINICAL DATA:  Epigastric pain. EXAM: ULTRASOUND ABDOMEN LIMITED RIGHT UPPER QUADRANT COMPARISON:  None. FINDINGS: Gallbladder:  Cholelithiasis with the largest measuring 15 mm. Non mobile calculus impacted in the gallbladder neck. Top-normal gallbladder wall thickness measuring 2.5 mm. Gallbladder is distended. Positive sonographic Murphy sign. No pericholecystic fluid. Common bile duct: Diameter: 2 mm Liver: Normal hepatic echogenicity. Three hyperechoic right hepatic masses measuring 1.1 x 1.1 x 1.2 cm, 0.5 x 0.8 x 0.5 cm, 0.5 x 0.6 x 0.7 cm respectively likely reflecting small hemangiomas. The  1.1 x 1.1 x 1.2 cm hepatic mass as central hypoechogenicity with peripheral hyperechogenicity likely reflecting an atypical hemangioma. Portal vein is patent on color Doppler imaging with normal direction of blood flow towards the liver. Other: None. IMPRESSION: 1. Cholelithiasis with a nonmobile calculus impacted in the gallbladder neck with gallbladder distention. Positive sonographic Murphy sign. Findings are concerning for, but not diagnostic of, acute cholecystitis. 2. 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 Recommend non emergent MRI of the abdomen without and with intravenous contrast for definitive characterization. Electronically Signed   By: Elige Ko M.D.   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 Rad recs outpt MRI abdomen f/u Acute cholecystitis  - [place] today with gallstone impacting GB neck - WBC mildly elevated at 11, afebrile  - LFTs and Tbili all WNL, lipase normal  - to OR for laparoscopic cholecystectomy, possible discharge from PACU if straightforward. If needed pending intra-operative findings will admit to observation.  I think she definitely has symptomatic cholelithiasis and possible early acute cholecystitis.  I think if we try to manage this nonoperatively and have her have outpatient follow-up and schedule outpatient elective surgery there would be a high risk of recurrent symptoms.  Therefore I recommended proceeding with laparoscopic  cholecystectomy with cholangiogram today  I believe the patient's symptoms are consistent with gallbladder disease.  We discussed gallbladder disease (husband was on phone). The patient was given Agricultural engineer. We discussed non-operative and operative management. We discussed the signs & symptoms of acute cholecystitis  I discussed laparoscopic cholecystectomy with IOC in detail.  The patient was shown diagrams detailing the procedure.  We discussed the risks and benefits of a laparoscopic cholecystectomy including, but not limited to bleeding, infection, injury to surrounding structures such as the intestine or liver, bile leak, retained gallstones, need to convert to an open procedure, prolonged diarrhea, blood clots such as  DVT, common bile duct injury, anesthesia risks, and possible need for additional procedures.  We discussed the typical post-operative recovery course. I explained that the likelihood of improvement of their symptoms is good.   I reviewed ED provider notes, last 24 h vitals and pain scores, last 24 h labs and trends, and last 24 h imaging results.  This care required moderate  level of medical decision making.   Mary Sella. Andrey Campanile, MD, FACS General, Bariatric, & Minimally Invasive Surgery [facility], [facility] 04/14/2021, 10:31 AM Please see Amion for pager number during day hours 7:00am-4:30pm

## 2021-04-14 NOTE — Op Note (Signed)
Amanda Caldwell ?[id number] ?1990-05-19 ?04/14/2021 ? ?Laparoscopic Cholecystectomy with IOC Procedure Note; right lateral abdominal wall tap block ? ?Indications: This patient presents with symptomatic gallbladder disease and will undergo laparoscopic cholecystectomy. ? ?Pre-operative Diagnosis: Calculus of gallbladder with acute cholecystitis, without mention of obstruction ? ?Post-operative Diagnosis: Same ? ?Surgeon: Gaynelle Adu MD FACS ? ?Assistants: none ? ?Anesthesia: General endotracheal anesthesia ? ?Procedure Details  ?The patient was seen again in the Holding Room. The risks, benefits, complications, treatment options, and expected outcomes were discussed with the patient. The possibilities of reaction to medication, pulmonary aspiration, perforation of viscus, bleeding, recurrent infection, finding a normal gallbladder, the need for additional procedures, failure to diagnose a condition, the possible need to convert to an open procedure, and creating a complication requiring transfusion or operation were discussed with the patient. The likelihood of improving the patient's symptoms with return to their baseline status is good.  The patient and/or family concurred with the proposed plan, giving informed consent. The site of surgery properly noted. The patient was taken to Operating Room, identified as [name] and the procedure verified as Laparoscopic Cholecystectomy with Intraoperative Cholangiogram. A Time Out was held and the above information confirmed. Antibiotic prophylaxis was administered.  ? ?Prior to the induction of general anesthesia, antibiotic prophylaxis was administered. General endotracheal anesthesia was then administered and tolerated well. After the induction, the abdomen was prepped with Chloraprep and draped in the sterile fashion. The patient was positioned in the supine position. ? ?Local anesthetic agent was injected into the skin near the umbilicus and an incision made. We  dissected down to the abdominal fascia with blunt dissection.  The fascia was incised vertically and we entered the peritoneal cavity bluntly.  A pursestring suture of 0-Vicryl was placed around the fascial opening.  The Hasson cannula was inserted and secured with the stay suture.  Pneumoperitoneum was then created with CO2 and tolerated well without any adverse changes in the patient's vital signs. An 5-mm port was placed in the subxiphoid position.  Two 5-mm ports were placed in the right upper quadrant. All skin incisions were infiltrated with a local anesthetic agent before making the incision and placing the trocars.  ? ?We positioned the patient in reverse Trendelenburg, tilted slightly to the patient's left.  The gallbladder was identified, the fundus grasped and retracted cephalad.  However in order to facilitate retraction the gallbladder had to be aspirated.  It was edematous and thick-walled.  Moreover the gallbladder was intrahepatic.  Adhesions were lysed bluntly and with the electrocautery where indicated, taking care not to injure any adjacent organs or viscus.  The duodenum was slightly tethered to the infundibulum.  This was taken down with gentle blunt dissection along with hook electrocautery staying very far away from the duodenum.  This allowed the duodenum to fall away and expose the infundibulum.  The infundibulum was grasped and retracted laterally, exposing the peritoneum overlying the triangle of Calot. This was then divided and exposed in a blunt fashion. A critical view of the cystic duct and cystic artery was obtained.  The cystic duct was clearly identified and bluntly dissected circumferentially. The cystic duct was ligated with a clip distally.   An incision was made in the cystic duct and the [name] cholangiogram catheter introduced. The catheter was secured using a clip. A cholangiogram was then obtained which showed good visualization of the distal and proximal biliary tree with no  sign of filling defects or obstruction.  Contrast flowed easily  into the duodenum. The catheter was then removed.  ? ?The cystic duct was then ligated with clips and divided. The cystic artery which had been identified & dissected free was ligated with clips and divided as well.  A small posterior branch of the cystic artery was also clipped and divided. ? ?The gallbladder was dissected from the liver bed in retrograde fashion with the electrocautery.  However the gallbladder was intrahepatic and posterior wall the gallbladder near the dome was fused with the liver.  The gallbladder was entered with spillage of thick bile as well as 3 large gallstones.  The gallbladder was removed and placed in an Ecco sac.  I was able to get most of the remaining back wall of the gallbladder off of the liver surface.  The spilled gallstones were placed in the specimen sac as well.  The gallbladder and Ecco sac were then removed through the umbilical port site. The liver bed was irrigated and inspected. Hemostasis was ultimately achieved with the electrocautery. Copious irrigation was utilized and was repeatedly aspirated until clear.  I did elect to place a piece of surgical snow in the gallbladder fossa.  The pursestring suture was used to close the umbilical fascia.  An additional interrupted 0 Vicryl was placed at the umbilical fascia with a 0 Vicryl with a PMI suture passer with laparoscopic guidance.  Local was infiltrated in this area.  Local was also infiltrated along the right lateral abdominal wall as a tap block for postoperative pain relief. ? ?We again inspected the right upper quadrant for hemostasis.  The umbilical closure was inspected and there was no air leak and nothing trapped within the closure. Pneumoperitoneum was released as we removed the trocars.  4-0 Monocryl was used to close the skin.   steri-strips, and clean dressings were applied. The patient was then extubated and brought to the recovery room in  stable condition. Instrument, sponge, and needle counts were correct at closure and at the conclusion of the case.  ? ?Findings: ?Distended inflammed edematous gallbladder  ?Cholecystitis with Cholelithiasis ?Intrahepatic gallbladder ?+snow ?Nml IOC ?+critical view ? ?Estimated Blood Loss:  less than 25 ml ?        ?Drains: none ?        ?Specimens: Gallbladder     ?      ?Complications: None; patient tolerated the procedure well. ?        ?Disposition: PACU - hemodynamically stable. ?        ?Condition: stable ? ?Mary Sella. Andrey Campanile, MD, FACS ?General, Bariatric, & Minimally Invasive Surgery ?[facility], [place] ? ?

## 2021-04-14 NOTE — ED Notes (Signed)
Ultrasound in room

## 2021-04-15 ENCOUNTER — Encounter (HOSPITAL_COMMUNITY): Payer: Self-pay | Admitting: General Surgery

## 2021-04-15 LAB — CBC
HCT: 35.9 % — ABNORMAL LOW (ref 36.0–46.0)
Hemoglobin: 11.6 g/dL — ABNORMAL LOW (ref 12.0–15.0)
MCH: 28.1 pg (ref 26.0–34.0)
MCHC: 32.3 g/dL (ref 30.0–36.0)
MCV: 86.9 fL (ref 80.0–100.0)
Platelets: 306 10*3/uL (ref 150–400)
RBC: 4.13 MIL/uL (ref 3.87–5.11)
RDW: 12.8 % (ref 11.5–15.5)
WBC: 10.8 10*3/uL — ABNORMAL HIGH (ref 4.0–10.5)
nRBC: 0 % (ref 0.0–0.2)

## 2021-04-15 MED ORDER — OXYCODONE HCL 5 MG PO TABS: 5.0000 mg | ORAL_TABLET | ORAL | 0 refills | Status: DC | PRN

## 2021-04-15 MED ORDER — ACETAMINOPHEN 500 MG PO TABS
1000.0000 mg | ORAL_TABLET | Freq: Three times a day (TID) | ORAL | Status: DC | PRN
Start: 2021-04-15 — End: 2022-03-26

## 2021-04-15 MED ORDER — DOCUSATE SODIUM 100 MG PO CAPS
100.0000 mg | ORAL_CAPSULE | Freq: Every day | ORAL | Status: DC | PRN
Start: 2021-04-15 — End: 2022-03-26

## 2021-04-15 MED ORDER — IBUPROFEN 600 MG PO TABS: 600.0000 mg | ORAL_TABLET | Freq: Three times a day (TID) | ORAL | 0 refills | Status: DC | PRN

## 2021-04-15 NOTE — Plan of Care (Signed)

## 2021-04-15 NOTE — Discharge Summary (Signed)
Physician Discharge Summary  ?Patient ID: ?Careers adviser ?MRN: [id number] ?DOB/AGE: Apr 09, 1990 31 y.o. ? ?Admit date: 04/14/2021 ?Discharge date: 04/15/2021 ? ?Admission Diagnoses: ?cholecystitis ?Discharge Diagnoses:  ?Principal Problem: ?  Acute cholecystitis ?Active Problems: ?  S/P laparoscopic cholecystectomy ? ? ?Discharged Condition: good ? ?Hospital Course: 89 yof s/p lap chole. Doing well. Labs look ok following day, no evidence of any issues from surgery tol diet.  Will dc home ?Will need outpatient MRI setup at follow up  ? ?Consults: None ? ?Significant Diagnostic Studies: [place] ? ?Treatments: surgery: lap chole ? ?Discharge Exam: ?Blood pressure 96/68, pulse 72, temperature 98.1 ?F (36.7 ?C), resp. rate 14, height 5\' 5"  (1.651 m), weight 59 kg, SpO2 97 %, currently breastfeeding. ?Abd soft approp tender incisions dry/clean ? ?Disposition:  ? ? ? ? Follow-up Information   ? ? Surgery, [place]. Schedule an appointment as soon as possible for a visit in 3 week(s).   ?Specialty: General Surgery ?Why: Our office is scheduling a post-op appointment in about 3 weeks, please call to confirm appointment date/time. Please arrive 30 min prior to appointment time and have ID and insurance card with you. ?Contact information: ?[address] ?[address] ?[place] [id number] ?[phone] ? ? ?  ?  ? ? Your primary care physician. Schedule an appointment as soon as possible for a visit .   ?Why: Will need follow up with PCP, outpatient MRI of your abdomen for liver lesions ? ?  ?  ? ?  ?  ? ?  ? ? ?Signed: ?Rolm Bookbinder ?04/15/2021, 11:08 AM ? ? ?

## 2021-04-15 NOTE — Progress Notes (Signed)
Mobility Specialist Progress Note  ? ? 04/15/21 1140  ?Mobility  ?Activity Ambulated independently in hallway  ?Level of Assistance Independent  ?Assistive Device None  ?Distance Ambulated (ft) 550 ft  ?Activity Response Tolerated well  ?$Mobility charge 1 Mobility  ? ?Pt received in bed and agreeable. No complaints. Returned to bed with call bell in reach.  ? ?Arrey Nation ?Mobility Specialist  ?M.S. 5N: [phone]  ?

## 2021-04-15 NOTE — Progress Notes (Signed)
Order to discharge pt home.  Discharge instructions/AVS given to patient and reviewed - education provided as needed.  Pt advised to call PCP and/or come back to the hospital if there are any problems. Pt verbalized understanding.    

## 2021-04-16 LAB — SURGICAL PATHOLOGY

## 2022-03-26 ENCOUNTER — Ambulatory Visit (HOSPITAL_BASED_OUTPATIENT_CLINIC_OR_DEPARTMENT_OTHER): Payer: Managed Care, Other (non HMO)

## 2022-03-26 ENCOUNTER — Ambulatory Visit (INDEPENDENT_AMBULATORY_CARE_PROVIDER_SITE_OTHER): Payer: Managed Care, Other (non HMO) | Admitting: Obstetrics & Gynecology

## 2022-03-26 ENCOUNTER — Encounter (HOSPITAL_BASED_OUTPATIENT_CLINIC_OR_DEPARTMENT_OTHER): Payer: Self-pay | Admitting: Obstetrics & Gynecology

## 2022-03-26 ENCOUNTER — Other Ambulatory Visit (HOSPITAL_COMMUNITY)
Admission: RE | Admit: 2022-03-26 | Discharge: 2022-03-26 | Disposition: A | Discharge status: Home / Self Care | Payer: Managed Care, Other (non HMO) | Source: Ambulatory Visit | Attending: Obstetrics & Gynecology | Admitting: Obstetrics & Gynecology

## 2022-03-26 ENCOUNTER — Encounter (HOSPITAL_BASED_OUTPATIENT_CLINIC_OR_DEPARTMENT_OTHER): Payer: Self-pay

## 2022-03-26 ENCOUNTER — Ambulatory Visit (HOSPITAL_BASED_OUTPATIENT_CLINIC_OR_DEPARTMENT_OTHER): Payer: Managed Care, Other (non HMO) | Admitting: *Deleted

## 2022-03-26 VITALS — BP 106/74 | HR 100 | Ht 65.0 in | Wt 141.0 lb

## 2022-03-26 DIAGNOSIS — O3680X1 Pregnancy with inconclusive fetal viability, fetus 1: Secondary | ICD-10-CM | POA: Diagnosis not present

## 2022-03-26 DIAGNOSIS — Z3481 Encounter for supervision of other normal pregnancy, first trimester: Secondary | ICD-10-CM | POA: Diagnosis present

## 2022-03-26 DIAGNOSIS — Z3A09 9 weeks gestation of pregnancy: Secondary | ICD-10-CM

## 2022-03-26 DIAGNOSIS — O3680X0 Pregnancy with inconclusive fetal viability, not applicable or unspecified: Secondary | ICD-10-CM

## 2022-03-26 DIAGNOSIS — Z3480 Encounter for supervision of other normal pregnancy, unspecified trimester: Secondary | ICD-10-CM

## 2022-03-26 NOTE — Progress Notes (Signed)
New OB Intake Pt presents to office today for new Ob intake. She is here with her husband and their two children.  I explained I am completing New OB Intake today. We discussed EDD of 10/28/22 that is based on LMP of 01/21/22. Pt is G3/P2. I reviewed her allergies, medications, Medical/Surgical/OB history, and appropriate screenings. Based on history, this is a low risk pregnancy.  Patient Active Problem List   Diagnosis Date Noted   Supervision of other normal pregnancy, antepartum 11/24/2019    Concerns addressed today  Delivery Plans Plans to deliver at [facility]. Patient given information for [facility] Healthy Baby website for more information about Women's and [place]. Patient is interested in water birth. Offered upcoming OB visit with CNM to discuss further.  MyChart/Babyscripts MyChart access verified. I explained pt will have some visits in office and some virtually. Babyscripts instructions given and order placed.  Blood Pressure Cuff/Weight Scale Patient has private insurance; instructed to purchase blood pressure cuff and bring to first prenatal appt. Explained after first prenatal appt pt will check weekly and document in 75. Patient does have weight scale.  Anatomy US Explained first scheduled [place] will be around 19 weeks. Anatomy US scheduled for 06/05/22 at 1330. Pt notified to arrive at 1315.  Labs Discussed Johnsie Cancel genetic screening with patient. Pt declines genetic screening   Social Determinants of Health Food Insecurity: Patient denies food insecurity. WIC Referral: Patient is not interested in referral to [facility].  Transportation: Patient denies transportation needs. Childcare: Discussed no children allowed at ultrasound appointments.     Blenda Nicely, RN 03/26/2022  2:58 PM

## 2022-03-26 NOTE — Progress Notes (Signed)
History:   Amanda Caldwell is a 32 y.o. G3P2002 at 14w1dby LMP being seen today for her first obstetrical visit.  Her obstetrical history is significant for  two prior normal deliveries . Patient does intend to breast feed. Pregnancy history fully reviewed.  Patient reports  some afternoon nausea but no issues with emesis.  Does not feel she needs any treatment for this. .Marland Kitchen     HISTORY: OB History  Gravida Para Term Preterm AB Living  3 2 2 $ 0 0 2  SAB IAB Ectopic Multiple Live Births  0 0 0 0 2    # Outcome Date GA Lbr Len/2nd Weight Sex Delivery Anes PTL Lv  3 Current           2 Term 05/24/20 [id number]:09 7 lb 2.8 oz (3.255 kg) M Vag-Spont None  LIV     Birth Comments: none     Name: Blackston,BOY Lysbeth     Apgar1: 8  Apgar5: 9  1 Term 05/15/18 [id number]:42 / 02:23 7 lb 9.5 oz (3.445 kg) M Vag-Spont None  LIV     Name: Forlenza,BOY Linzi     Apgar1: 8  Apgar5: 9    Last pap smear was done 2017 and was declined today.  Has her two small sons with her.  Would like to have this done at next in person appt.  Past Medical History:  Diagnosis Date   Medical history non-contributory    Past Surgical History:  Procedure Laterality Date   CHOLECYSTECTOMY N/A 04/14/2021   Procedure: LAPAROSCOPIC CHOLECYSTECTOMY WITH INTRAOPERATIVE CHOLANGIOGRAM;  Surgeon: WilGreer PickerelD;  [facility]  Laterality: N/A;   ELBOW CLOSED REDUCTION W/ PER[place]NO PAST SURGERIES     Family History  Problem Relation Age of Onset   Diabetes Mother    Obesity Mother    Anxiety disorder Brother    Hypertension Maternal Grandmother    Depression Maternal Grandmother    Cancer Maternal Grandmother        bladder   Cancer Maternal Grandfather        lung   Social History   Tobacco Use   Smoking status: Never   Smokeless tobacco: Never  Vaping Use   Vaping Use: Never used  Substance Use Topics   Alcohol use: Never   Drug use: Never   Allergies  Allergen  Reactions   Amoxicillin Swelling   No current outpatient medications on file prior to visit.   No current facility-administered medications on file prior to visit.    Review of Systems Pertinent items noted in HPI and remainder of comprehensive ROS otherwise negative.  Physical Exam:   Vitals:   03/26/22 1431  BP: 106/74  Pulse: 100  Weight: 141 lb (64 kg)  Height: 5' 5"$  (1.651 m)     General: well-developed, well-nourished female in no acute distress  Breasts:  deferred  Skin: normal coloration and turgor, no rashes  Neurologic: oriented, normal, negative, normal mood  Extremities: normal strength, tone, and muscle mass, ROM of all joints is normal  HEENT PERRLA, extraocular movement intact and sclera clear, anicteric  Neck supple and no masses  Cardiovascular: regular rate and rhythm  Respiratory:  no respiratory distress, normal breath sounds  Abdomen: soft, non-tender; bowel sounds normal; no masses,  no organomegaly  Pelvic: deferred    Assessment:    Pregnancy: [id number] Active Problem List   Diagnosis Date Noted  Supervision of other normal pregnancy, antepartum 11/24/2019     Plan:    1. Encounter for supervision of other normal pregnancy in first trimester - on PNV - considering water birth - ABO/Rh - Antibody screen - CBC - Hepatitis B surface antigen - HIV Antibody (routine testing w rflx) - HIV (Save tube for possible reflex) - RPR - Rubella screen - Hepatitis C antibody - Urine Culture - Cervicovaginal ancillary only( [place]) - [place] MFM OB COMP + 93 WK; Future   Initial labs drawn. Continue prenatal vitamins. Problem list reviewed and updated. Genetic Screening discussed, NIPS: declined. Ultrasound discussed; fetal anatomic survey: ordered. Anticipatory guidance about prenatal visits given including labs, ultrasounds, and testing. Discussed usage of [employer] and virtual visits as additional source of managing and completing  prenatal visits in midst of coronavirus and pandemic.   Encouraged to complete MyChart Registration for her ability to review results, send requests, and have questions addressed.  The nature of [facility] for [facility] with multiple MDs and Advanced Practice Providers was explained to patient; also emphasized that residents, students are part of our team. Routine obstetric precautions reviewed. Encouraged to seek out care at office or emergency room [facility] preferred) for urgent and/or emergent concerns. Return in about 4 weeks (around 04/23/2022).     Felipa Emory, MD, [place], [facility] for [facility], [place]

## 2022-03-27 LAB — CERVICOVAGINAL ANCILLARY ONLY
Chlamydia: NEGATIVE
Comment: NEGATIVE
Comment: NORMAL
Neisseria Gonorrhea: NEGATIVE

## 2022-03-27 LAB — CBC
Hematocrit: 38.4 % (ref 34.0–46.6)
Hemoglobin: 12.5 g/dL (ref 11.1–15.9)
MCH: 28.3 pg (ref 26.6–33.0)
MCHC: 32.6 g/dL (ref 31.5–35.7)
MCV: 87 fL (ref 79–97)
Platelets: 275 10*3/uL (ref 150–450)
RBC: 4.41 x10E6/uL (ref 3.77–5.28)
RDW: 14 % (ref 11.7–15.4)
WBC: 8.4 10*3/uL (ref 3.4–10.8)

## 2022-03-27 LAB — HEPATITIS C ANTIBODY: Hep C Virus Ab: NONREACTIVE

## 2022-03-27 LAB — SYPHILIS: RPR W/REFLEX TO RPR TITER AND TREPONEMAL ANTIBODIES, TRADITIONAL SCREENING AND DIAGNOSIS ALGORITHM: RPR Ser Ql: NONREACTIVE

## 2022-03-27 LAB — ANTIBODY SCREEN: Antibody Screen: NEGATIVE

## 2022-03-27 LAB — HEPATITIS B SURFACE ANTIGEN: Hepatitis B Surface Ag: NEGATIVE

## 2022-03-27 LAB — RUBELLA SCREEN: Rubella Antibodies, IGG: 9.74 {index}

## 2022-03-27 LAB — HIV ANTIBODY (ROUTINE TESTING W REFLEX): HIV Screen 4th Generation wRfx: NONREACTIVE

## 2022-03-27 LAB — ABO/RH: Rh Factor: POSITIVE

## 2022-03-29 LAB — URINE CULTURE

## 2022-03-31 ENCOUNTER — Encounter: Payer: Self-pay | Admitting: *Deleted

## 2022-04-16 ENCOUNTER — Encounter (HOSPITAL_BASED_OUTPATIENT_CLINIC_OR_DEPARTMENT_OTHER): Payer: Managed Care, Other (non HMO) | Admitting: Obstetrics & Gynecology

## 2022-05-15 ENCOUNTER — Encounter (HOSPITAL_BASED_OUTPATIENT_CLINIC_OR_DEPARTMENT_OTHER): Payer: Managed Care, Other (non HMO) | Admitting: Obstetrics & Gynecology

## 2022-06-05 ENCOUNTER — Ambulatory Visit: Payer: Managed Care, Other (non HMO)

## 2022-06-11 ENCOUNTER — Encounter (HOSPITAL_BASED_OUTPATIENT_CLINIC_OR_DEPARTMENT_OTHER): Payer: Managed Care, Other (non HMO) | Admitting: Obstetrics & Gynecology

## 2022-07-05 IMAGING — US US ABDOMEN LIMITED
1 series · 13 of 25 positions shown · non-contrast
Comparison: None.

CLINICAL DATA: Epigastric pain.

EXAM:
ULTRASOUND ABDOMEN LIMITED RIGHT UPPER QUADRANT

[Series 1: us abdomen limited ruq (liver/gb) · 13 of 82 slices shown]
[im 1/82]
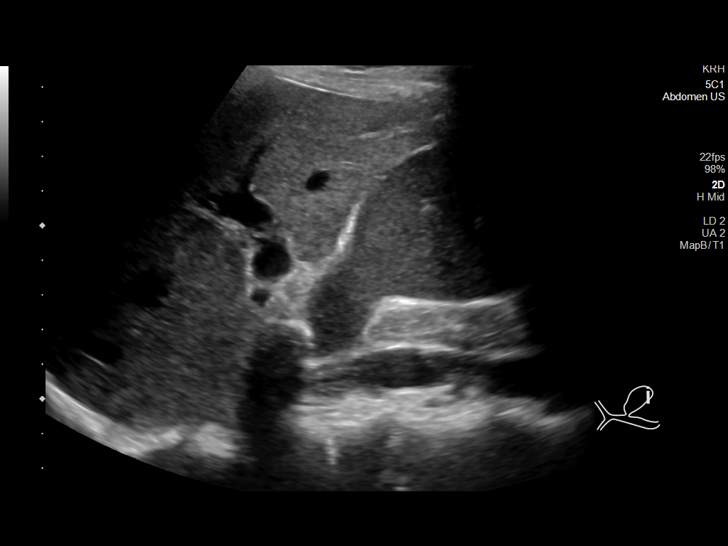
[im 7/82]
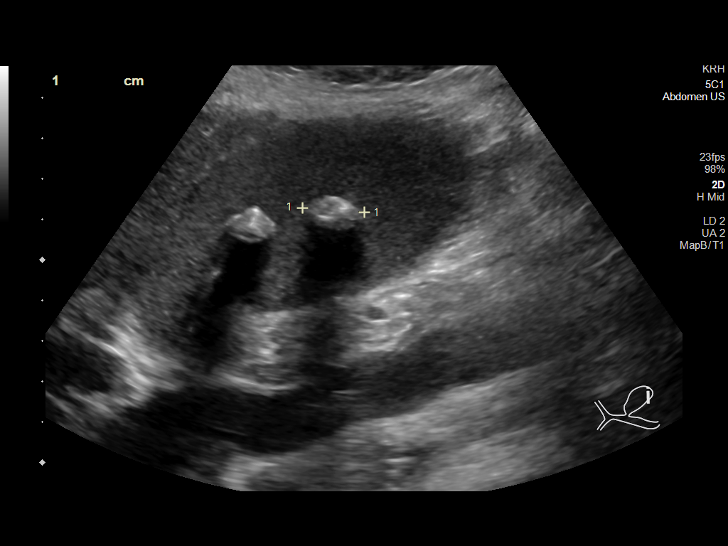
[im 14/82]
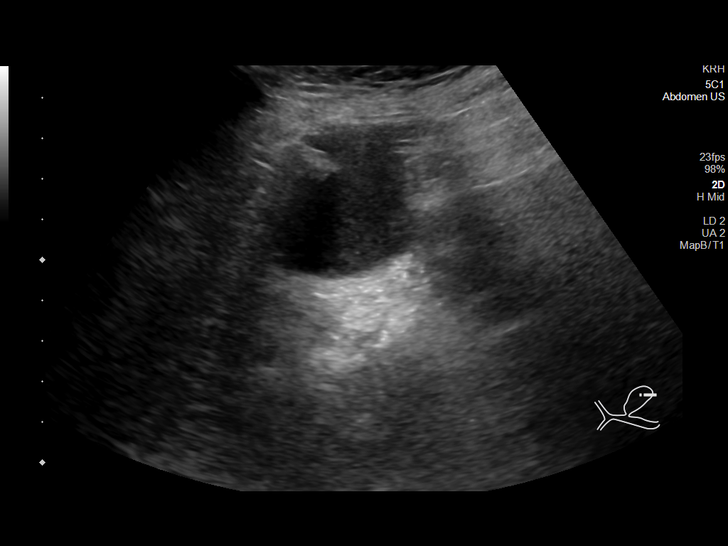
[im 21/82]
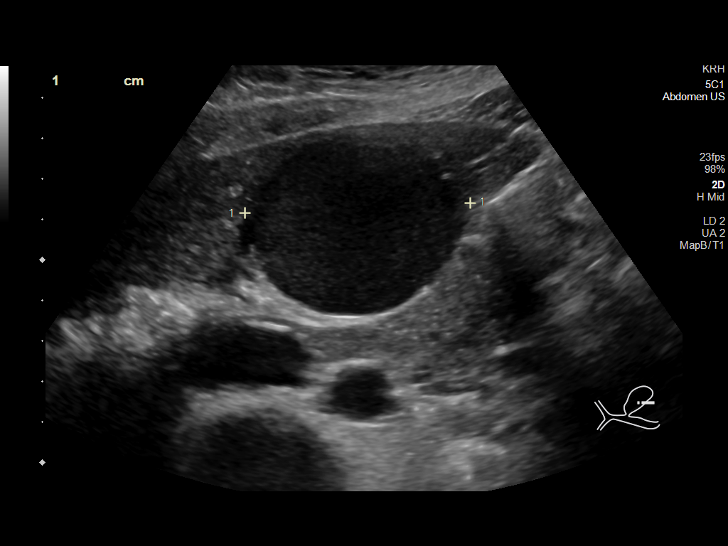
[im 28/82]
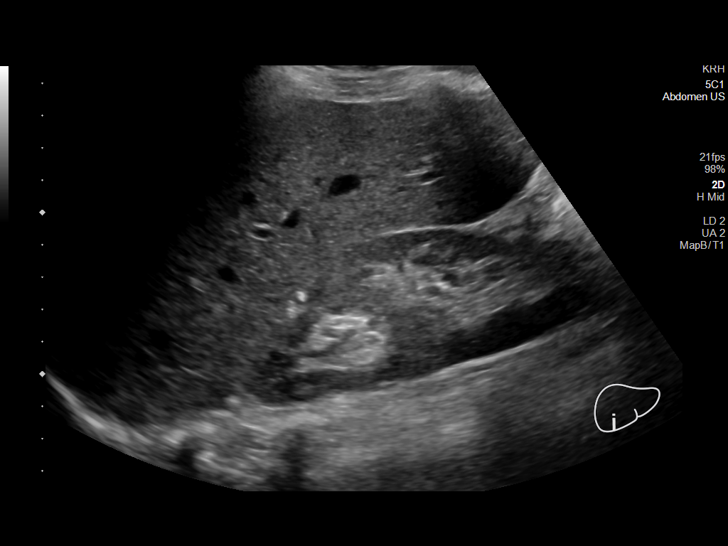
[im 34/82]
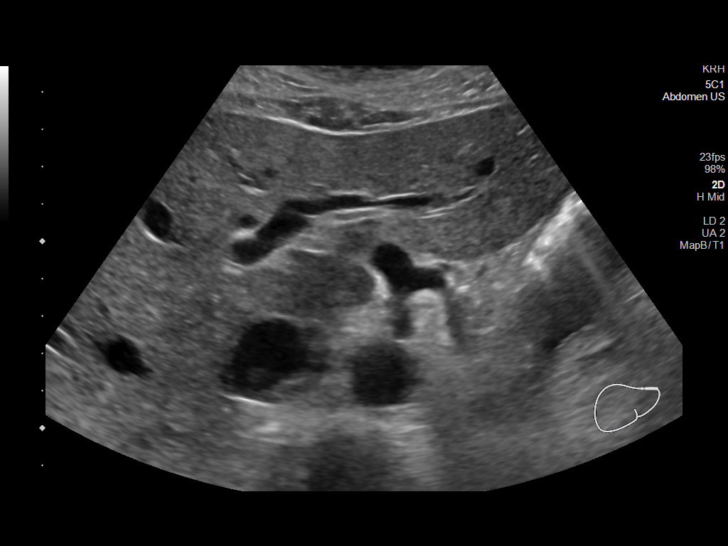
[im 41/82]
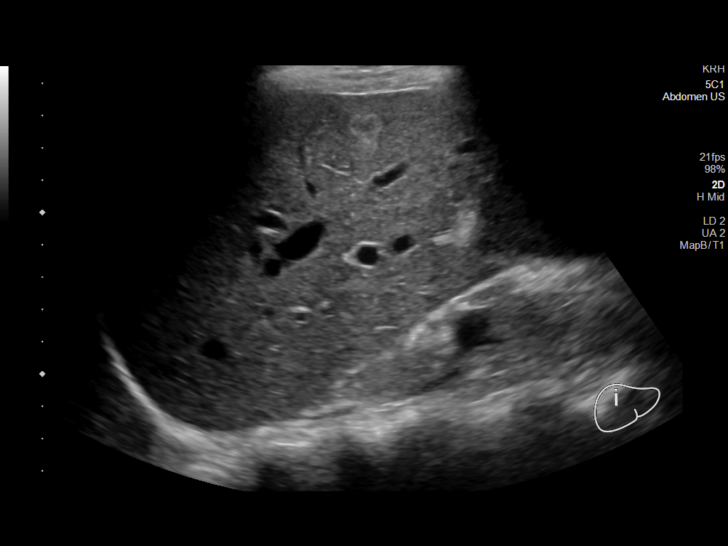
[im 48/82]
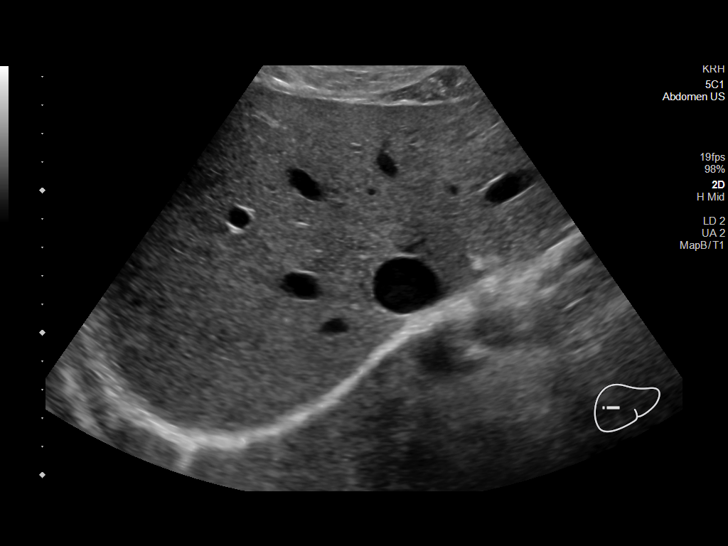
[im 55/82]
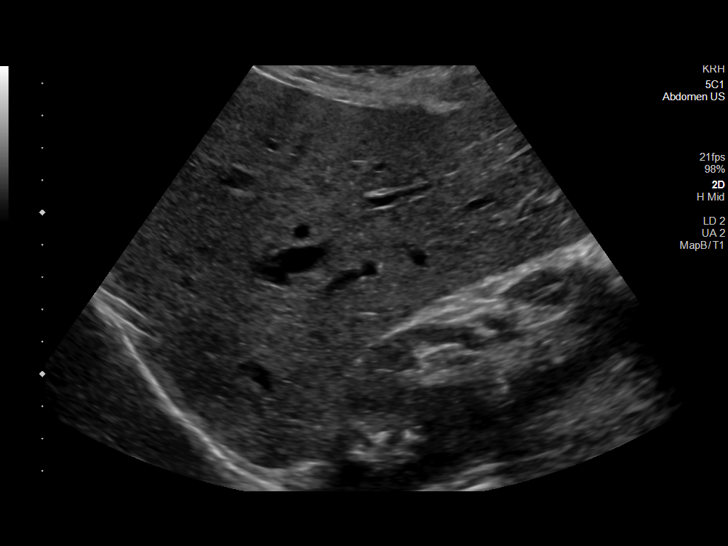
[im 61/82]
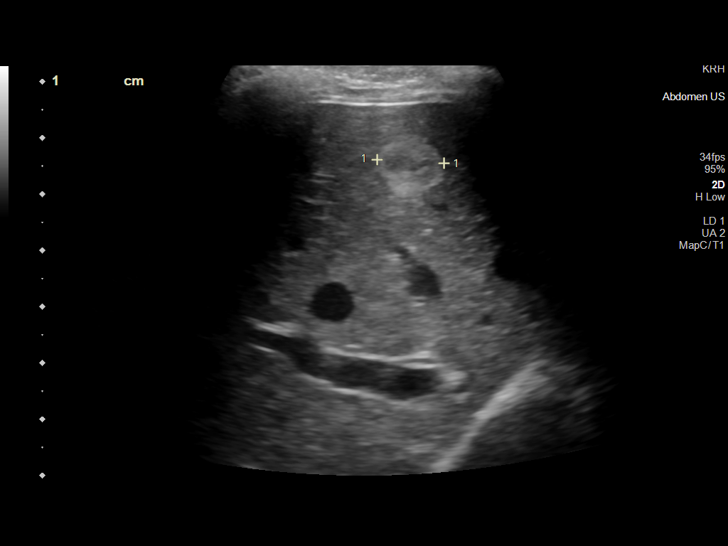
[im 68/82]
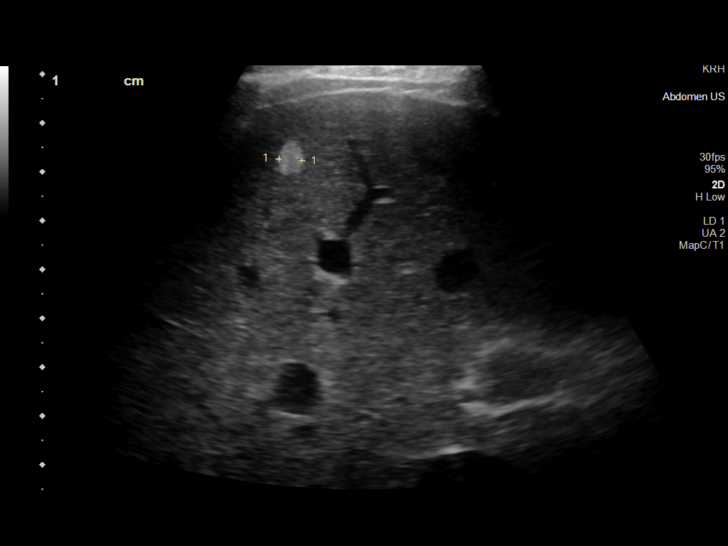
[im 75/82]
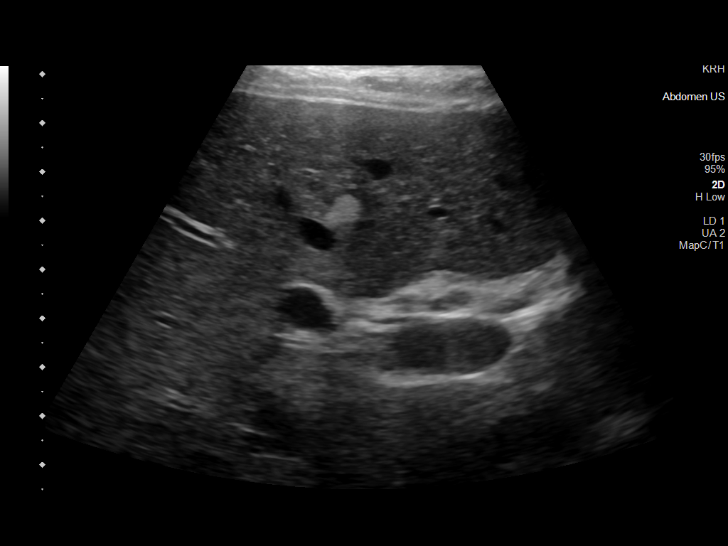
[im 82/82]
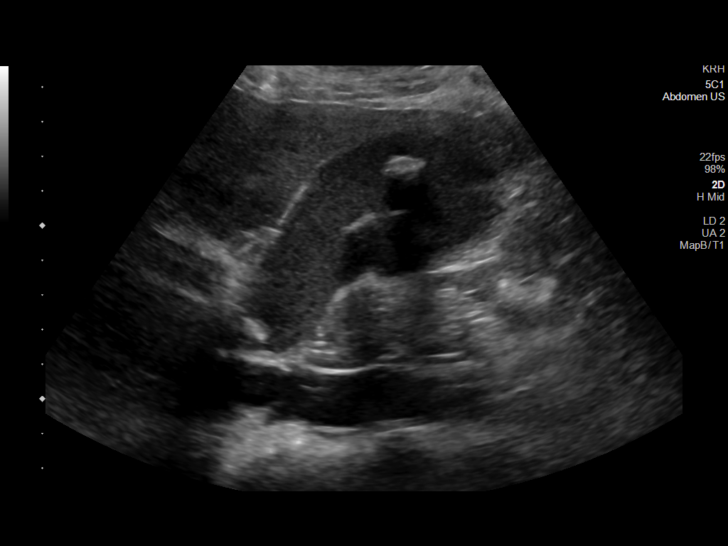

[13 of 25 positions shown; findings below may reference images not displayed]

FINDINGS: Gallbladder:

Cholelithiasis with the largest measuring 15 mm. Non mobile calculus
impacted in the gallbladder neck. Top-normal gallbladder wall
thickness measuring 2.5 mm. Gallbladder is distended. Positive
sonographic Murphy sign. No pericholecystic fluid.

Common bile duct:

Diameter: 2 mm

Liver:

Normal hepatic echogenicity. Three hyperechoic right hepatic masses
measuring 1.1 x 1.1 x 1.2 cm, 0.5 x 0.8 x 0.5 cm, 0.5 x 0.6 x 0.7 cm
respectively likely reflecting small hemangiomas. The 1.1 x 1.1 x
1.2 cm hepatic mass as central hypoechogenicity with peripheral
hyperechogenicity likely reflecting an atypical hemangioma. Portal
vein is patent on color Doppler imaging with normal direction of
blood flow towards the liver.

Other: None.
IMPRESSION: 1. Cholelithiasis with a nonmobile calculus impacted in the
gallbladder neck with gallbladder distention. Positive sonographic
Murphy sign. Findings are concerning for, but not diagnostic of,
acute cholecystitis.
2. Three hyperechoic right hepatic masses likely reflecting
hemangiomas. Recommend non emergent MRI of the abdomen without and
with intravenous contrast for definitive characterization.

## 2022-07-10 ENCOUNTER — Encounter (HOSPITAL_BASED_OUTPATIENT_CLINIC_OR_DEPARTMENT_OTHER): Payer: Managed Care, Other (non HMO) | Admitting: Obstetrics & Gynecology

## 2022-07-31 ENCOUNTER — Encounter (HOSPITAL_BASED_OUTPATIENT_CLINIC_OR_DEPARTMENT_OTHER): Payer: Managed Care, Other (non HMO) | Admitting: Obstetrics & Gynecology

## 2022-08-15 ENCOUNTER — Encounter (HOSPITAL_BASED_OUTPATIENT_CLINIC_OR_DEPARTMENT_OTHER): Payer: Managed Care, Other (non HMO) | Admitting: Obstetrics & Gynecology

## 2022-08-20 ENCOUNTER — Encounter (HOSPITAL_BASED_OUTPATIENT_CLINIC_OR_DEPARTMENT_OTHER): Payer: Managed Care, Other (non HMO) | Admitting: Obstetrics & Gynecology

## 2022-08-28 ENCOUNTER — Encounter (HOSPITAL_BASED_OUTPATIENT_CLINIC_OR_DEPARTMENT_OTHER): Payer: Managed Care, Other (non HMO) | Admitting: Obstetrics & Gynecology

## 2022-09-04 ENCOUNTER — Encounter (HOSPITAL_BASED_OUTPATIENT_CLINIC_OR_DEPARTMENT_OTHER): Payer: Managed Care, Other (non HMO) | Admitting: Obstetrics & Gynecology

## 2022-09-18 ENCOUNTER — Encounter (HOSPITAL_BASED_OUTPATIENT_CLINIC_OR_DEPARTMENT_OTHER): Payer: Managed Care, Other (non HMO) | Admitting: Obstetrics & Gynecology

## 2022-10-02 ENCOUNTER — Encounter (HOSPITAL_BASED_OUTPATIENT_CLINIC_OR_DEPARTMENT_OTHER): Payer: Managed Care, Other (non HMO) | Admitting: Obstetrics & Gynecology

## 2022-10-09 ENCOUNTER — Encounter (HOSPITAL_BASED_OUTPATIENT_CLINIC_OR_DEPARTMENT_OTHER): Payer: Managed Care, Other (non HMO) | Admitting: Obstetrics & Gynecology

## 2022-10-16 ENCOUNTER — Encounter (HOSPITAL_BASED_OUTPATIENT_CLINIC_OR_DEPARTMENT_OTHER): Payer: Managed Care, Other (non HMO) | Admitting: Obstetrics & Gynecology

## 2022-10-23 ENCOUNTER — Encounter (HOSPITAL_BASED_OUTPATIENT_CLINIC_OR_DEPARTMENT_OTHER): Payer: Managed Care, Other (non HMO) | Admitting: Obstetrics & Gynecology

## 2022-10-30 ENCOUNTER — Encounter (HOSPITAL_BASED_OUTPATIENT_CLINIC_OR_DEPARTMENT_OTHER): Payer: Managed Care, Other (non HMO) | Admitting: Obstetrics & Gynecology
